# Patient Record
Sex: Male | Born: 1965 | Race: Black or African American | Hispanic: No | Marital: Married | Smoking: Never smoker
Health system: Southern US, Community
[De-identification: ages and names within clinical notes are randomized; demographics above are authoritative.]

## PROBLEM LIST (undated history)

## (undated) DIAGNOSIS — R51 Headache: Secondary | ICD-10-CM

## (undated) DIAGNOSIS — D649 Anemia, unspecified: Secondary | ICD-10-CM

## (undated) DIAGNOSIS — Z8669 Personal history of other diseases of the nervous system and sense organs: Secondary | ICD-10-CM

## (undated) DIAGNOSIS — C801 Malignant (primary) neoplasm, unspecified: Secondary | ICD-10-CM

## (undated) DIAGNOSIS — Z87442 Personal history of urinary calculi: Secondary | ICD-10-CM

## (undated) DIAGNOSIS — Z800 Family history of malignant neoplasm of digestive organs: Secondary | ICD-10-CM

## (undated) DIAGNOSIS — K6389 Other specified diseases of intestine: Secondary | ICD-10-CM

## (undated) DIAGNOSIS — C189 Malignant neoplasm of colon, unspecified: Secondary | ICD-10-CM

## (undated) DIAGNOSIS — R519 Headache, unspecified: Secondary | ICD-10-CM

## (undated) DIAGNOSIS — G2581 Restless legs syndrome: Secondary | ICD-10-CM

## (undated) HISTORY — DX: Malignant neoplasm of colon, unspecified: C18.9

## (undated) HISTORY — DX: Headache: R51

## (undated) HISTORY — DX: Anemia, unspecified: D64.9

## (undated) HISTORY — DX: Malignant (primary) neoplasm, unspecified: C80.1

## (undated) HISTORY — DX: Headache, unspecified: R51.9

## (undated) HISTORY — DX: Family history of malignant neoplasm of digestive organs: Z80.0

---

## 2008-04-11 ENCOUNTER — Emergency Department (HOSPITAL_COMMUNITY): Admission: EM | Admit: 2008-04-11 | Discharge: 2008-04-12 | Payer: Self-pay | Admitting: Emergency Medicine

## 2011-03-22 LAB — URINE MICROSCOPIC-ADD ON

## 2011-03-22 LAB — URINALYSIS, ROUTINE W REFLEX MICROSCOPIC
Bilirubin Urine: NEGATIVE
Glucose, UA: NEGATIVE
Ketones, ur: NEGATIVE
Leukocytes, UA: NEGATIVE
Nitrite: NEGATIVE
Protein, ur: NEGATIVE
Specific Gravity, Urine: 1.024
Urobilinogen, UA: 1
pH: 7.5

## 2012-04-04 ENCOUNTER — Ambulatory Visit
Admission: RE | Admit: 2012-04-04 | Discharge: 2012-04-04 | Disposition: A | Discharge status: Home / Self Care | Payer: BC Managed Care – PPO | Source: Ambulatory Visit | Attending: Family Medicine | Admitting: Family Medicine

## 2012-04-04 ENCOUNTER — Other Ambulatory Visit: Payer: Self-pay | Admitting: Family Medicine

## 2012-04-04 DIAGNOSIS — R509 Fever, unspecified: Secondary | ICD-10-CM

## 2012-04-04 DIAGNOSIS — R05 Cough: Secondary | ICD-10-CM

## 2012-04-04 DIAGNOSIS — R059 Cough, unspecified: Secondary | ICD-10-CM

## 2012-04-04 DIAGNOSIS — R0989 Other specified symptoms and signs involving the circulatory and respiratory systems: Secondary | ICD-10-CM

## 2012-08-14 ENCOUNTER — Telehealth: Payer: Self-pay | Admitting: Gastroenterology

## 2012-08-14 NOTE — Telephone Encounter (Signed)
Is he specifically asking for me or any [place] MD? We need to review his records.

## 2012-08-15 NOTE — Telephone Encounter (Signed)
Specifically asked for you, Dr Arlyce Dice. Records should be on your desk fo review.

## 2012-09-07 ENCOUNTER — Other Ambulatory Visit: Payer: Self-pay | Admitting: Gastroenterology

## 2012-09-07 DIAGNOSIS — R1013 Epigastric pain: Secondary | ICD-10-CM

## 2012-09-14 ENCOUNTER — Ambulatory Visit
Admission: RE | Admit: 2012-09-14 | Discharge: 2012-09-14 | Disposition: A | Discharge status: Home / Self Care | Payer: BC Managed Care – PPO | Source: Ambulatory Visit | Attending: Gastroenterology | Admitting: Gastroenterology

## 2012-09-14 DIAGNOSIS — R1013 Epigastric pain: Secondary | ICD-10-CM

## 2012-10-12 ENCOUNTER — Other Ambulatory Visit (HOSPITAL_COMMUNITY): Payer: Self-pay | Admitting: Gastroenterology

## 2012-10-12 DIAGNOSIS — R109 Unspecified abdominal pain: Secondary | ICD-10-CM

## 2012-10-24 ENCOUNTER — Encounter (HOSPITAL_COMMUNITY)
Admission: RE | Admit: 2012-10-24 | Discharge: 2012-10-24 | Disposition: A | Discharge status: Home / Self Care | Payer: BC Managed Care – PPO | Source: Ambulatory Visit | Attending: Gastroenterology | Admitting: Gastroenterology

## 2012-10-24 DIAGNOSIS — R109 Unspecified abdominal pain: Secondary | ICD-10-CM

## 2012-10-24 DIAGNOSIS — R1013 Epigastric pain: Secondary | ICD-10-CM | POA: Insufficient documentation

## 2012-10-24 MED ORDER — SINCALIDE 5 MCG IJ SOLR
INTRAMUSCULAR | Status: AC
  Administered 2012-10-24: 5 ug via INTRAVENOUS
  Filled 2012-10-24: qty 5

## 2012-10-24 MED ORDER — TECHNETIUM TC 99M MEBROFENIN IV KIT
5.0000 | PACK | Freq: Once | INTRAVENOUS | Status: AC | PRN
  Administered 2012-10-24: 5 via INTRAVENOUS

## 2012-10-24 MED ORDER — SINCALIDE 5 MCG IJ SOLR
0.0200 ug/kg | Freq: Once | INTRAMUSCULAR | Status: AC
  Administered 2012-10-24: 5 ug via INTRAVENOUS

## 2012-12-24 ENCOUNTER — Other Ambulatory Visit: Payer: Self-pay | Admitting: Family Medicine

## 2012-12-24 DIAGNOSIS — E611 Iron deficiency: Secondary | ICD-10-CM

## 2012-12-24 DIAGNOSIS — R109 Unspecified abdominal pain: Secondary | ICD-10-CM

## 2012-12-25 ENCOUNTER — Ambulatory Visit
Admission: RE | Admit: 2012-12-25 | Discharge: 2012-12-25 | Disposition: A | Discharge status: Home / Self Care | Payer: BC Managed Care – PPO | Source: Ambulatory Visit | Attending: Family Medicine | Admitting: Family Medicine

## 2012-12-25 DIAGNOSIS — E611 Iron deficiency: Secondary | ICD-10-CM

## 2012-12-25 DIAGNOSIS — R109 Unspecified abdominal pain: Secondary | ICD-10-CM

## 2012-12-25 MED ORDER — IOHEXOL 300 MG/ML  SOLN
100.0000 mL | Freq: Once | INTRAMUSCULAR | Status: AC | PRN
  Administered 2012-12-25: 100 mL via INTRAVENOUS

## 2013-01-01 ENCOUNTER — Inpatient Hospital Stay (HOSPITAL_COMMUNITY)
Admission: AD | Admit: 2013-01-01 | Discharge: 2013-01-09 | DRG: 148 | Disposition: A | Discharge status: Home / Self Care | Payer: BC Managed Care – PPO | Source: Ambulatory Visit | Attending: Surgery | Admitting: Surgery

## 2013-01-01 ENCOUNTER — Inpatient Hospital Stay (HOSPITAL_COMMUNITY): Payer: BC Managed Care – PPO

## 2013-01-01 DIAGNOSIS — C189 Malignant neoplasm of colon, unspecified: Secondary | ICD-10-CM

## 2013-01-01 DIAGNOSIS — Z87442 Personal history of urinary calculi: Secondary | ICD-10-CM | POA: Diagnosis present

## 2013-01-01 DIAGNOSIS — Y9210 Unspecified residential institution as the place of occurrence of the external cause: Secondary | ICD-10-CM | POA: Diagnosis present

## 2013-01-01 DIAGNOSIS — G43909 Migraine, unspecified, not intractable, without status migrainosus: Secondary | ICD-10-CM | POA: Diagnosis present

## 2013-01-01 DIAGNOSIS — Z8669 Personal history of other diseases of the nervous system and sense organs: Secondary | ICD-10-CM

## 2013-01-01 DIAGNOSIS — G2581 Restless legs syndrome: Secondary | ICD-10-CM | POA: Diagnosis present

## 2013-01-01 DIAGNOSIS — D490 Neoplasm of unspecified behavior of digestive system: Secondary | ICD-10-CM

## 2013-01-01 DIAGNOSIS — Y836 Removal of other organ (partial) (total) as the cause of abnormal reaction of the patient, or of later complication, without mention of misadventure at the time of the procedure: Secondary | ICD-10-CM | POA: Diagnosis not present

## 2013-01-01 DIAGNOSIS — K560 Paralytic ileus: Secondary | ICD-10-CM | POA: Diagnosis not present

## 2013-01-01 DIAGNOSIS — K6389 Other specified diseases of intestine: Secondary | ICD-10-CM | POA: Diagnosis present

## 2013-01-01 DIAGNOSIS — D509 Iron deficiency anemia, unspecified: Secondary | ICD-10-CM | POA: Diagnosis present

## 2013-01-01 HISTORY — DX: Other specified diseases of intestine: K63.89

## 2013-01-01 HISTORY — DX: Restless legs syndrome: G25.81

## 2013-01-01 HISTORY — DX: Personal history of other diseases of the nervous system and sense organs: Z86.69

## 2013-01-01 HISTORY — DX: Personal history of urinary calculi: Z87.442

## 2013-01-01 LAB — BASIC METABOLIC PANEL
BUN: 16 mg/dL (ref 6–23)
CO2: 29 mEq/L (ref 19–32)
Calcium: 9.1 mg/dL (ref 8.4–10.5)
Chloride: 107 mEq/L (ref 96–112)
Creatinine, Ser: 1.12 mg/dL (ref 0.50–1.35)
GFR calc Af Amer: >90 mL/min (ref >90)
GFR calc non Af Amer: 78 mL/min — ABNORMAL LOW (ref >90)
Glucose, Bld: 86 mg/dL (ref 70–99)
Potassium: 4.1 mEq/L (ref 3.5–5.1)
Sodium: 142 mEq/L (ref 135–145)

## 2013-01-01 LAB — CBC WITH DIFFERENTIAL/PLATELET
Basophils Absolute: 0.1 10*3/uL (ref 0.0–0.1)
Basophils Relative: 1 % (ref 0–1)
Eosinophils Absolute: 0.2 10*3/uL (ref 0.0–0.7)
Eosinophils Relative: 3 % (ref 0–5)
HCT: 32.2 % — ABNORMAL LOW (ref 39.0–52.0)
Hemoglobin: 10 g/dL — ABNORMAL LOW (ref 13.0–17.0)
Lymphocytes Relative: 23 % (ref 12–46)
Lymphs Abs: 1.6 10*3/uL (ref 0.7–4.0)
MCH: 21.6 pg — ABNORMAL LOW (ref 26.0–34.0)
MCHC: 31.1 g/dL (ref 30.0–36.0)
MCV: 69.7 fL — ABNORMAL LOW (ref 78.0–100.0)
Monocytes Absolute: 0.5 10*3/uL (ref 0.1–1.0)
Monocytes Relative: 7 % (ref 3–12)
Neutro Abs: 4.4 10*3/uL (ref 1.7–7.7)
Neutrophils Relative %: 66 % (ref 43–77)
Platelets: 271 10*3/uL (ref 150–400)
RBC: 4.62 MIL/uL (ref 4.22–5.81)
RDW: 16.6 % — ABNORMAL HIGH (ref 11.5–15.5)
WBC: 6.8 10*3/uL (ref 4.0–10.5)

## 2013-01-01 LAB — COMPREHENSIVE METABOLIC PANEL
ALT: 14 U/L (ref 0–53)
AST: 15 U/L (ref 0–37)
Albumin: 3.4 g/dL — ABNORMAL LOW (ref 3.5–5.2)
Alkaline Phosphatase: 86 U/L (ref 39–117)
BUN: 15 mg/dL (ref 6–23)
CO2: 29 mEq/L (ref 19–32)
Calcium: 9.1 mg/dL (ref 8.4–10.5)
Chloride: 106 mEq/L (ref 96–112)
Creatinine, Ser: 1.12 mg/dL (ref 0.50–1.35)
GFR calc Af Amer: >90 mL/min (ref >90)
GFR calc non Af Amer: 78 mL/min — ABNORMAL LOW (ref >90)
Glucose, Bld: 85 mg/dL (ref 70–99)
Potassium: 4.1 mEq/L (ref 3.5–5.1)
Sodium: 142 mEq/L (ref 135–145)
Total Bilirubin: 0.5 mg/dL (ref 0.3–1.2)
Total Protein: 6.9 g/dL (ref 6.0–8.3)

## 2013-01-01 LAB — CBC
HCT: 32.5 % — ABNORMAL LOW (ref 39.0–52.0)
Hemoglobin: 10 g/dL — ABNORMAL LOW (ref 13.0–17.0)
MCH: 21.4 pg — ABNORMAL LOW (ref 26.0–34.0)
MCHC: 30.8 g/dL (ref 30.0–36.0)
MCV: 69.4 fL — ABNORMAL LOW (ref 78.0–100.0)
Platelets: 257 10*3/uL (ref 150–400)
RBC: 4.68 MIL/uL (ref 4.22–5.81)
RDW: 16.6 % — ABNORMAL HIGH (ref 11.5–15.5)
WBC: 6.7 10*3/uL (ref 4.0–10.5)

## 2013-01-01 LAB — PROTIME-INR
INR: 0.95 (ref 0.00–1.49)
INR: 1.08 (ref 0.00–1.49)
Prothrombin Time: 12.5 seconds (ref 11.6–15.2)
Prothrombin Time: 13.8 seconds (ref 11.6–15.2)

## 2013-01-01 LAB — TYPE AND SCREEN
ABO/RH(D): O POS
Antibody Screen: NEGATIVE

## 2013-01-01 MED ORDER — ACETAMINOPHEN 650 MG RE SUPP: 650.0000 mg | Freq: Four times a day (QID) | RECTAL | Status: DC | PRN

## 2013-01-01 MED ORDER — ACETAMINOPHEN 325 MG PO TABS: 650.0000 mg | ORAL_TABLET | Freq: Four times a day (QID) | ORAL | Status: DC | PRN

## 2013-01-01 MED ORDER — METRONIDAZOLE IN NACL 5-0.79 MG/ML-% IV SOLN
500.0000 mg | INTRAVENOUS | Status: AC
  Administered 2013-01-02: 500 mg via INTRAVENOUS
  Filled 2013-01-01: qty 100

## 2013-01-01 MED ORDER — ALVIMOPAN 12 MG PO CAPS
12.0000 mg | ORAL_CAPSULE | Freq: Once | ORAL | Status: AC
  Administered 2013-01-02: 12 mg via ORAL
  Filled 2013-01-01: qty 1

## 2013-01-01 MED ORDER — SODIUM CHLORIDE 0.9 % IV SOLN
INTRAVENOUS | Status: DC
  Administered 2013-01-01 – 2013-01-02 (×2): via INTRAVENOUS

## 2013-01-01 MED ORDER — CEFAZOLIN SODIUM-DEXTROSE 2-3 GM-% IV SOLR
2.0000 g | INTRAVENOUS | Status: AC
  Administered 2013-01-02: 2 g via INTRAVENOUS
  Filled 2013-01-01: qty 50

## 2013-01-01 MED ORDER — ONDANSETRON HCL 4 MG PO TABS: 4.0000 mg | ORAL_TABLET | Freq: Four times a day (QID) | ORAL | Status: DC | PRN

## 2013-01-01 MED ORDER — ONDANSETRON HCL 4 MG/2ML IJ SOLN
4.0000 mg | Freq: Four times a day (QID) | INTRAMUSCULAR | Status: DC | PRN
  Administered 2013-01-03 – 2013-01-04 (×4): 4 mg via INTRAVENOUS
  Filled 2013-01-01 (×4): qty 2

## 2013-01-01 MED ORDER — PRAMIPEXOLE DIHYDROCHLORIDE 0.25 MG PO TABS
0.2500 mg | ORAL_TABLET | Freq: Every evening | ORAL | Status: DC | PRN
  Filled 2013-01-01 (×18): qty 1

## 2013-01-01 MED ORDER — MORPHINE SULFATE 2 MG/ML IJ SOLN: 2.0000 mg | Freq: Three times a day (TID) | INTRAMUSCULAR | Status: DC | PRN

## 2013-01-01 MED ORDER — ZOLPIDEM TARTRATE 5 MG PO TABS: 5.0000 mg | ORAL_TABLET | Freq: Every evening | ORAL | Status: DC | PRN

## 2013-01-01 NOTE — Consult Note (Addendum)
Reason for Consult:obstructing colon cancer Referring Physician:  Gurkaran Rahm is an 47 y.o. male.  HPI:  Pt is a 47 year old male who presents following a colonoscopy which is positive for a near obstructing right colon cancer.  He actually has had a year of symptoms. He has felt like he has a blockage. He says he has 3-4 weeks where he feels OK, but then has 3-4 days of nausea and vomiting.  He has been anemic as well.  He feels like he has had rumbling pain that passes across his abdomen.  He has been taking ibuprofen which has helped with the pain.  He underwent ultrasound and [place] this April, which were both normal.  He finally had ct scan last week which demonstrated a right colon mass.  He was admitted from GI today as Dr. Bosie Clos was unable to pass the scope past the mass.  He has generally been able to eat, and he has not had significant weight loss.  He denies frank blood in his stool.  He has actually done stool cards which were negative for blood.    PMH: Migraines; Restless leg syndrome; History of Kidney stones   PSH: wisdom teeth removal   SH: Denies smoking or alcohol   Meds: Allegra-D; Sumatriptan; Mirapex (see pharmacy notes for doses)  PFH:  Maternal great aunt with colon cancer, no other malignancies known in family.    No family history on file.  Social History:  has no tobacco, alcohol, and drug history on file.  Allergies:  Allergies  Allergen Reactions  . Bee Venom Anaphylaxis  . Fioricet (Butalbital-Apap-Caffeine) Other (See Comments)    dizziness     Results for orders placed during the hospital encounter of 01/01/13 (from the past 48 hour(s))  COMPREHENSIVE METABOLIC PANEL     Status: Abnormal   Collection Time    01/01/13  6:34 PM      Result Value Range   Sodium 142  135 - 145 mEq/L   Potassium 4.1  3.5 - 5.1 mEq/L   Chloride 106  96 - 112 mEq/L   CO2 29  19 - 32 mEq/L   Glucose, Bld 85  70 - 99 mg/dL   BUN 15  6 - 23 mg/dL    Creatinine, Ser 1.91  0.50 - 1.35 mg/dL   Calcium 9.1  8.4 - 47.8 mg/dL   Total Protein 6.9  6.0 - 8.3 g/dL   Albumin 3.4 (*) 3.5 - 5.2 g/dL   AST 15  0 - 37 U/L   ALT 14  0 - 53 U/L   Alkaline Phosphatase 86  39 - 117 U/L   Total Bilirubin 0.5  0.3 - 1.2 mg/dL   GFR calc non Af Amer 78 (*) >90 mL/min   GFR calc Af Amer >90  >90 mL/min   Comment:            The eGFR has been calculated     using the CKD EPI equation.     This calculation has not been     validated in all clinical     situations.     eGFR's persistently     <90 mL/min signify     possible Chronic Kidney Disease.  CBC WITH DIFFERENTIAL     Status: Abnormal   Collection Time    01/01/13  6:34 PM      Result Value Range   WBC 6.8  4.0 - 10.5 K/uL   RBC  4.62  4.22 - 5.81 MIL/uL   Hemoglobin 10.0 (*) 13.0 - 17.0 g/dL   HCT 16.1 (*) 09.6 - 04.5 %   MCV 69.7 (*) 78.0 - 100.0 fL   MCH 21.6 (*) 26.0 - 34.0 pg   MCHC 31.1  30.0 - 36.0 g/dL   RDW 40.9 (*) 81.1 - 91.4 %   Platelets 271  150 - 400 K/uL   Neutrophils Relative % 66  43 - 77 %   Lymphocytes Relative 23  12 - 46 %   Monocytes Relative 7  3 - 12 %   Eosinophils Relative 3  0 - 5 %   Basophils Relative 1  0 - 1 %   Neutro Abs 4.4  1.7 - 7.7 K/uL   Lymphs Abs 1.6  0.7 - 4.0 K/uL   Monocytes Absolute 0.5  0.1 - 1.0 K/uL   Eosinophils Absolute 0.2  0.0 - 0.7 K/uL   Basophils Absolute 0.1  0.0 - 0.1 K/uL   Smear Review MORPHOLOGY UNREMARKABLE    PROTIME-INR     Status: None   Collection Time    01/01/13  6:34 PM      Result Value Range   Prothrombin Time 12.5  11.6 - 15.2 seconds   INR 0.95  0.00 - 1.49  BASIC METABOLIC PANEL     Status: Abnormal   Collection Time    01/01/13  6:34 PM      Result Value Range   Sodium 142  135 - 145 mEq/L   Potassium 4.1  3.5 - 5.1 mEq/L   Chloride 107  96 - 112 mEq/L   CO2 29  19 - 32 mEq/L   Glucose, Bld 86  70 - 99 mg/dL   BUN 16  6 - 23 mg/dL   Creatinine, Ser 7.82  0.50 - 1.35 mg/dL   Calcium 9.1  8.4 - 95.6  mg/dL   GFR calc non Af Amer 78 (*) >90 mL/min   GFR calc Af Amer >90  >90 mL/min   Comment:            The eGFR has been calculated     using the CKD EPI equation.     This calculation has not been     validated in all clinical     situations.     eGFR's persistently     <90 mL/min signify     possible Chronic Kidney Disease.  CBC     Status: Abnormal   Collection Time    01/01/13  6:34 PM      Result Value Range   WBC 6.7  4.0 - 10.5 K/uL   RBC 4.68  4.22 - 5.81 MIL/uL   Hemoglobin 10.0 (*) 13.0 - 17.0 g/dL   HCT 21.3 (*) 08.6 - 57.8 %   MCV 69.4 (*) 78.0 - 100.0 fL   MCH 21.4 (*) 26.0 - 34.0 pg   MCHC 30.8  30.0 - 36.0 g/dL   RDW 46.9 (*) 62.9 - 52.8 %   Platelets 257  150 - 400 K/uL    X-ray Chest Pa And Lateral   01/01/2013   *RADIOLOGY REPORT*  Clinical Data: Preop evaluation.  Colon cancer.  CHEST - 2 VIEW  Comparison: 04/04/2012  Findings: Two views of the chest demonstrate clear lungs.  Heart and mediastinum are within normal limits.  The trachea is midline. Gas-filled loops of bowel in the upper abdomen.  Bony thorax is intact.  IMPRESSION: No acute cardiopulmonary disease.  Original Report Authenticated By: Richarda Overlie, M.D.    Review of Systems  Constitutional: Negative.   HENT: Positive for nosebleeds.   Eyes: Negative.   Respiratory: Negative.   Cardiovascular: Negative.   Gastrointestinal: Positive for nausea, vomiting, abdominal pain and constipation.  Genitourinary: Negative.   Musculoskeletal: Negative.   Skin: Negative.   Neurological: Negative.   Endo/Heme/Allergies: Negative.   Psychiatric/Behavioral: Negative.    Blood pressure 126/80, pulse 80, temperature 97.9 F (36.6 C), temperature source Oral, resp. rate 17, SpO2 100.00%. Physical Exam  Constitutional: He is oriented to person, place, and time. He appears well-developed and well-nourished. No distress.  HENT:  Head: Normocephalic and atraumatic.  Mouth/Throat: Oropharynx is clear and  moist.  Eyes: Conjunctivae are normal. Pupils are equal, round, and reactive to light. Right eye exhibits no discharge. Left eye exhibits no discharge. No scleral icterus.  Neck: Normal range of motion. Neck supple. No thyromegaly present.  Cardiovascular: Normal rate, regular rhythm, normal heart sounds and intact distal pulses.  Exam reveals no gallop and no friction rub.   No murmur heard. Respiratory: Effort normal and breath sounds normal. No respiratory distress. He has no wheezes. He has no rales. He exhibits no tenderness.  GI: Soft. Bowel sounds are normal. He exhibits no distension and no mass. There is no tenderness. There is no rebound and no guarding.  Musculoskeletal: He exhibits no edema and no tenderness.  Neurological: He is alert and oriented to person, place, and time. Coordination normal.  Skin: Skin is warm and dry. No rash noted. He is not diaphoretic. No erythema. No pallor.  Psychiatric: He has a normal mood and affect. His behavior is normal. Judgment and thought content normal.    Assessment/Plan: Colon cancer- ascending colon, pathology pending.   Pt has already been bowel prepped. CEA pending CT negative for metastatic disease  Needs partial colectomy of proximal colon in urgent time frame (within a few days). Will set up for Dr. Carolynne Edouard. Hope for surgery tomorrow barring unforeseen consequences.   Consent, antibiotics OCTOR Entereg pre op. NPO after midnight.    Reviewed risks and benefits of colectomy including bleeding, infection, leak, possible need for further surgeries or procedures, possible risk of transfusion, possible need for opening incision, blood clot, pneumonia, urinary infection, etc.   Reviewed the actual surgery, incisions, post op course, recovery. Dr. Carolynne Edouard to see tomorrow.    Will get type and screen Will need chest CT as part of staging.  Further treatment will depend on final pathology.  60 min spent in evaluation, examination,  coordination of care, and counseling with patient and family.  >50% of time spent in counseling.    Byron Tipping 01/01/2013, 9:47 PM

## 2013-01-01 NOTE — H&P (Signed)
  Jeremiah Sanford is an 47 y.o. male.   Chief Complaint: Colon mass; Iron deficiency anemia   HPI: Jeremiah Sanford is a 47 yo man who has been having epigastric pain where he sometimes would hear "gurgling" sounds in his abdomen that would dissipate at times and he would sometimes feel like stool was "moving through my colon" and the pain would dissipate. Denies any trouble moving his bowels and and reports normal formed stools. Had one episode of bleeding about 4 months ago after defecation that resolved after 1-2 days. Recently found to be anemic with Hgb 9.9 and CT was done by his PCP, Dr. Manus Gunning and a large mass was noted in the ascending colon. A colonoscopy done today showed an obstructing fungating mass in the ascending colon. A lumen proximal to the mass could not be seen and the colonoscope did not traverse the lesion. The distal margin was tattooed with inkspot. Patient is being admitted for IV hydration and management of his obstructing colon malignancy, which will require surgery.  PMH: Migraines; Restless leg syndrome; History of Kidney stones  PSH: wisdom teeth removal  FH: noncontributory  SH: Denies smoking or alcohol  Meds: Allegra-D; Sumatriptan; Mirapex (see pharmacy notes for doses)  Allergies: Fioricet: dizziness  ROS: Negative from a GI standpoint except as stated above   VITALS: AFVSS   PHYSICAL EXAM: General appearance: alert, well-nourished, no acute distress HENT: oropharynx clear, no erythema Eyes: anicteric Skin: no jaundice Resp: clear to ascultation bilaterally Cardio: regular, rate, and rhythm no murmurs, rubs, or gallops GI: soft, nontender, nondistended, +BS, no mass appreciated Rectal: nontender, normal DRE, no blood on gloved finger Psych: normal affect, mood   Assessment/Plan 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 D/W Dr. Donell Beers. Preop CEA and labs ordered. Ice chips and sips of water ok today  and NPO p MN. Dr. Matthias Hughs to see tomorrow. Dr. Carolynne Edouard is surgeon to f/u tomorrow.  Jeremiah Baranek C. 01/01/2013, 5:35 PM

## 2013-01-02 ENCOUNTER — Encounter (HOSPITAL_COMMUNITY): Payer: Self-pay | Admitting: Anesthesiology

## 2013-01-02 ENCOUNTER — Encounter (HOSPITAL_COMMUNITY): Admission: AD | Disposition: A | Discharge status: Home / Self Care | Payer: Self-pay | Source: Ambulatory Visit

## 2013-01-02 ENCOUNTER — Inpatient Hospital Stay (HOSPITAL_COMMUNITY): Payer: BC Managed Care – PPO | Admitting: Anesthesiology

## 2013-01-02 DIAGNOSIS — C19 Malignant neoplasm of rectosigmoid junction: Secondary | ICD-10-CM

## 2013-01-02 HISTORY — PX: LAPAROSCOPIC PARTIAL COLECTOMY: SHX5907

## 2013-01-02 HISTORY — PX: PARTIAL COLECTOMY: SHX5273

## 2013-01-02 LAB — CEA: CEA: <0.5 ng/mL (ref 0.0–5.0)

## 2013-01-02 LAB — SURGICAL PCR SCREEN
MRSA, PCR: NEGATIVE
Staphylococcus aureus: NEGATIVE

## 2013-01-02 LAB — ABO/RH: ABO/RH(D): O POS

## 2013-01-02 SURGERY — LAPAROSCOPIC PARTIAL COLECTOMY
Anesthesia: General | Site: Abdomen | Laterality: Right

## 2013-01-02 MED ORDER — ARTIFICIAL TEARS OP OINT
TOPICAL_OINTMENT | OPHTHALMIC | Status: DC | PRN
  Administered 2013-01-02: 1 via OPHTHALMIC

## 2013-01-02 MED ORDER — SODIUM CHLORIDE 0.9 % IR SOLN
Status: DC | PRN
  Administered 2013-01-02: 1000 mL

## 2013-01-02 MED ORDER — LIDOCAINE HCL (CARDIAC) 20 MG/ML IV SOLN
INTRAVENOUS | Status: DC | PRN
  Administered 2013-01-02: 60 mg via INTRAVENOUS

## 2013-01-02 MED ORDER — BIOTENE DRY MOUTH MT LIQD
15.0000 mL | Freq: Two times a day (BID) | OROMUCOSAL | Status: DC
  Administered 2013-01-03 – 2013-01-07 (×7): 15 mL via OROMUCOSAL

## 2013-01-02 MED ORDER — HYDROMORPHONE HCL PF 1 MG/ML IJ SOLN
0.2500 mg | INTRAMUSCULAR | Status: DC | PRN
  Administered 2013-01-02: 1 mg via INTRAVENOUS

## 2013-01-02 MED ORDER — ONDANSETRON HCL 4 MG/2ML IJ SOLN: 4.0000 mg | Freq: Four times a day (QID) | INTRAMUSCULAR | Status: DC | PRN

## 2013-01-02 MED ORDER — CHLORHEXIDINE GLUCONATE 0.12 % MT SOLN
15.0000 mL | Freq: Two times a day (BID) | OROMUCOSAL | Status: DC
  Administered 2013-01-02 – 2013-01-07 (×8): 15 mL via OROMUCOSAL
  Filled 2013-01-02 (×8): qty 15

## 2013-01-02 MED ORDER — 0.9 % SODIUM CHLORIDE (POUR BTL) OPTIME
TOPICAL | Status: DC | PRN
  Administered 2013-01-02: 2000 mL

## 2013-01-02 MED ORDER — MIDAZOLAM HCL 5 MG/5ML IJ SOLN
INTRAMUSCULAR | Status: DC | PRN
  Administered 2013-01-02: 2 mg via INTRAVENOUS

## 2013-01-02 MED ORDER — PROPOFOL 10 MG/ML IV BOLUS
INTRAVENOUS | Status: DC | PRN
  Administered 2013-01-02: 200 mg via INTRAVENOUS

## 2013-01-02 MED ORDER — MORPHINE SULFATE (PF) 1 MG/ML IV SOLN
INTRAVENOUS | Status: DC
  Administered 2013-01-02 (×2): via INTRAVENOUS
  Administered 2013-01-02: 9 mg via INTRAVENOUS
  Administered 2013-01-02: 14.81 mg via INTRAVENOUS
  Administered 2013-01-03: 12 mg via INTRAVENOUS
  Administered 2013-01-03 (×3): via INTRAVENOUS
  Administered 2013-01-03: 10.7 mg via INTRAVENOUS
  Administered 2013-01-03: 3.95 mg via INTRAVENOUS
  Filled 2013-01-02 (×4): qty 25

## 2013-01-02 MED ORDER — BUPIVACAINE-EPINEPHRINE 0.25% -1:200000 IJ SOLN
INTRAMUSCULAR | Status: DC | PRN
  Administered 2013-01-02: 15 mL

## 2013-01-02 MED ORDER — ALBUMIN HUMAN 5 % IV SOLN
INTRAVENOUS | Status: DC | PRN
  Administered 2013-01-02: 13:00:00 via INTRAVENOUS

## 2013-01-02 MED ORDER — ROCURONIUM BROMIDE 100 MG/10ML IV SOLN
INTRAVENOUS | Status: DC | PRN
  Administered 2013-01-02: 50 mg via INTRAVENOUS
  Administered 2013-01-02: 10 mg via INTRAVENOUS
  Administered 2013-01-02: 20 mg via INTRAVENOUS

## 2013-01-02 MED ORDER — NALOXONE HCL 0.4 MG/ML IJ SOLN: 0.4000 mg | INTRAMUSCULAR | Status: DC | PRN

## 2013-01-02 MED ORDER — DIPHENHYDRAMINE HCL 50 MG/ML IJ SOLN: 25.0000 mg | Freq: Four times a day (QID) | INTRAMUSCULAR | Status: DC | PRN

## 2013-01-02 MED ORDER — SODIUM CHLORIDE 0.9 % IJ SOLN: 9.0000 mL | INTRAMUSCULAR | Status: DC | PRN

## 2013-01-02 MED ORDER — ONDANSETRON HCL 4 MG/2ML IJ SOLN
INTRAMUSCULAR | Status: DC | PRN
  Administered 2013-01-02: 4 mg via INTRAVENOUS

## 2013-01-02 MED ORDER — ALVIMOPAN 12 MG PO CAPS
12.0000 mg | ORAL_CAPSULE | Freq: Two times a day (BID) | ORAL | Status: DC
  Administered 2013-01-03 – 2013-01-08 (×6): 12 mg via ORAL
  Filled 2013-01-02 (×13): qty 1

## 2013-01-02 MED ORDER — KCL IN DEXTROSE-NACL 20-5-0.45 MEQ/L-%-% IV SOLN
INTRAVENOUS | Status: DC
  Administered 2013-01-02 – 2013-01-08 (×11): via INTRAVENOUS
  Filled 2013-01-02 (×18): qty 1000

## 2013-01-02 MED ORDER — OXYCODONE HCL 5 MG PO TABS: 5.0000 mg | ORAL_TABLET | Freq: Once | ORAL | Status: DC | PRN

## 2013-01-02 MED ORDER — HEPARIN SODIUM (PORCINE) 5000 UNIT/ML IJ SOLN
5000.0000 [IU] | Freq: Three times a day (TID) | INTRAMUSCULAR | Status: DC
  Administered 2013-01-03 – 2013-01-09 (×18): 5000 [IU] via SUBCUTANEOUS
  Filled 2013-01-02 (×23): qty 1

## 2013-01-02 MED ORDER — LACTATED RINGERS IV SOLN
INTRAVENOUS | Status: DC | PRN
  Administered 2013-01-02 (×4): via INTRAVENOUS

## 2013-01-02 MED ORDER — HYDROMORPHONE HCL PF 1 MG/ML IJ SOLN
INTRAMUSCULAR | Status: DC | PRN
  Administered 2013-01-02: .5 mg via INTRAVENOUS

## 2013-01-02 MED ORDER — PROMETHAZINE HCL 25 MG/ML IJ SOLN
12.5000 mg | Freq: Four times a day (QID) | INTRAMUSCULAR | Status: DC | PRN
  Administered 2013-01-02 – 2013-01-03 (×2): 12.5 mg via INTRAVENOUS
  Filled 2013-01-02: qty 1

## 2013-01-02 MED ORDER — NEOSTIGMINE METHYLSULFATE 1 MG/ML IJ SOLN
INTRAMUSCULAR | Status: DC | PRN
  Administered 2013-01-02: 5 mg via INTRAVENOUS

## 2013-01-02 MED ORDER — GLYCOPYRROLATE 0.2 MG/ML IJ SOLN
INTRAMUSCULAR | Status: DC | PRN
  Administered 2013-01-02: 0.6 mg via INTRAVENOUS

## 2013-01-02 MED ORDER — FENTANYL CITRATE 0.05 MG/ML IJ SOLN
INTRAMUSCULAR | Status: DC | PRN
  Administered 2013-01-02: 50 ug via INTRAVENOUS
  Administered 2013-01-02 (×2): 100 ug via INTRAVENOUS
  Administered 2013-01-02 (×2): 50 ug via INTRAVENOUS
  Administered 2013-01-02: 100 ug via INTRAVENOUS
  Administered 2013-01-02: 50 ug via INTRAVENOUS

## 2013-01-02 MED ORDER — DIPHENHYDRAMINE HCL 25 MG PO CAPS: 25.0000 mg | ORAL_CAPSULE | Freq: Four times a day (QID) | ORAL | Status: DC | PRN

## 2013-01-02 MED ORDER — ACETAMINOPHEN 325 MG PO TABS
650.0000 mg | ORAL_TABLET | Freq: Four times a day (QID) | ORAL | Status: DC | PRN
  Filled 2013-01-02: qty 2

## 2013-01-02 MED ORDER — ALUM & MAG HYDROXIDE-SIMETH 200-200-20 MG/5ML PO SUSP: 30.0000 mL | Freq: Four times a day (QID) | ORAL | Status: DC | PRN

## 2013-01-02 MED ORDER — OXYCODONE HCL 5 MG/5ML PO SOLN: 5.0000 mg | Freq: Once | ORAL | Status: DC | PRN

## 2013-01-02 SURGICAL SUPPLY — 71 items
APPLIER CLIP ROT 10 11.4 M/L (STAPLE)
BLADE SURG 10 STRL SS (BLADE) ×3 IMPLANT
BLADE SURG ROTATE 9660 (MISCELLANEOUS) IMPLANT
CANISTER SUCTION 2500CC (MISCELLANEOUS) ×3 IMPLANT
CELLS DAT CNTRL 66122 CELL SVR (MISCELLANEOUS) ×2 IMPLANT
CHLORAPREP W/TINT 26ML (MISCELLANEOUS) ×3 IMPLANT
CLIP APPLIE ROT 10 11.4 M/L (STAPLE) IMPLANT
CLOTH BEACON ORANGE TIMEOUT ST (SAFETY) ×3 IMPLANT
COVER MAYO STAND STRL (DRAPES) ×3 IMPLANT
COVER SURGICAL LIGHT HANDLE (MISCELLANEOUS) ×3 IMPLANT
DECANTER SPIKE VIAL GLASS SM (MISCELLANEOUS) ×3 IMPLANT
DRAPE PROXIMA HALF (DRAPES) IMPLANT
DRAPE UTILITY 15X26 W/TAPE STR (DRAPE) ×9 IMPLANT
DRAPE WARM FLUID 44X44 (DRAPE) ×3 IMPLANT
DRSG OPSITE 11X17.75 LRG (GAUZE/BANDAGES/DRESSINGS) ×3 IMPLANT
ELECT BLADE 6.5 EXT (BLADE) ×3 IMPLANT
ELECT CAUTERY BLADE 6.4 (BLADE) ×6 IMPLANT
ELECT REM PT RETURN 9FT ADLT (ELECTROSURGICAL) ×3
ELECTRODE REM PT RTRN 9FT ADLT (ELECTROSURGICAL) ×2 IMPLANT
GEL ULTRASOUND 20GR AQUASONIC (MISCELLANEOUS) IMPLANT
GLOVE BIO SURGEON STRL SZ7.5 (GLOVE) ×15 IMPLANT
GLOVE BIOGEL PI IND STRL 7.0 (GLOVE) ×4 IMPLANT
GLOVE BIOGEL PI IND STRL 7.5 (GLOVE) ×4 IMPLANT
GLOVE BIOGEL PI INDICATOR 7.0 (GLOVE) ×2
GLOVE BIOGEL PI INDICATOR 7.5 (GLOVE) ×2
GLOVE ECLIPSE 7.0 STRL STRAW (GLOVE) ×6 IMPLANT
GLOVE ECLIPSE 7.5 STRL STRAW (GLOVE) ×9 IMPLANT
GOWN STRL NON-REIN LRG LVL3 (GOWN DISPOSABLE) ×18 IMPLANT
KIT BASIN OR (CUSTOM PROCEDURE TRAY) ×3 IMPLANT
KIT ROOM TURNOVER OR (KITS) ×3 IMPLANT
LIGASURE IMPACT 36 18CM CVD LR (INSTRUMENTS) ×3 IMPLANT
NS IRRIG 1000ML POUR BTL (IV SOLUTION) ×6 IMPLANT
PAD ARMBOARD 7.5X6 YLW CONV (MISCELLANEOUS) ×6 IMPLANT
PAD SHARPS MAGNETIC DISPOSAL (MISCELLANEOUS) ×3 IMPLANT
PENCIL BUTTON HOLSTER BLD 10FT (ELECTRODE) ×3 IMPLANT
RELOAD PROXIMATE 75MM BLUE (ENDOMECHANICALS) ×6 IMPLANT
RTRCTR WOUND ALEXIS 18CM MED (MISCELLANEOUS) ×3
SCALPEL HARMONIC ACE (MISCELLANEOUS) ×3 IMPLANT
SCISSORS LAP 5X35 DISP (ENDOMECHANICALS) IMPLANT
SET IRRIG TUBING LAPAROSCOPIC (IRRIGATION / IRRIGATOR) IMPLANT
SLEEVE ENDOPATH XCEL 5M (ENDOMECHANICALS) ×3 IMPLANT
SPECIMEN JAR LARGE (MISCELLANEOUS) ×3 IMPLANT
STAPLER GUN LINEAR PROX 60 (STAPLE) ×3 IMPLANT
STAPLER PROXIMATE 75MM BLUE (STAPLE) ×3 IMPLANT
STAPLER VISISTAT 35W (STAPLE) ×3 IMPLANT
SURGILUBE 2OZ TUBE FLIPTOP (MISCELLANEOUS) IMPLANT
SUT PDS AB 1 CT  36 (SUTURE) ×2
SUT PDS AB 1 CT 36 (SUTURE) ×4 IMPLANT
SUT PDS AB 1 TP1 96 (SUTURE) ×6 IMPLANT
SUT PROLENE 2 0 CT2 30 (SUTURE) IMPLANT
SUT PROLENE 2 0 KS (SUTURE) IMPLANT
SUT SILK 2 0 (SUTURE) ×1
SUT SILK 2 0 SH CR/8 (SUTURE) ×3 IMPLANT
SUT SILK 2-0 18XBRD TIE 12 (SUTURE) ×2 IMPLANT
SUT SILK 3 0 (SUTURE) ×1
SUT SILK 3 0 SH CR/8 (SUTURE) ×3 IMPLANT
SUT SILK 3-0 18XBRD TIE 12 (SUTURE) ×2 IMPLANT
SYR BULB IRRIGATION 50ML (SYRINGE) ×3 IMPLANT
SYS LAPSCP GELPORT 120MM (MISCELLANEOUS)
SYSTEM LAPSCP GELPORT 120MM (MISCELLANEOUS) IMPLANT
TAPE CLOTH SURG 4X10 WHT LF (GAUZE/BANDAGES/DRESSINGS) ×3 IMPLANT
TOWEL OR 17X26 10 PK STRL BLUE (TOWEL DISPOSABLE) ×6 IMPLANT
TRAY FOLEY CATH 14FRSI W/METER (CATHETERS) ×3 IMPLANT
TRAY LAPAROSCOPIC (CUSTOM PROCEDURE TRAY) ×3 IMPLANT
TROCAR XCEL 12X100 BLDLESS (ENDOMECHANICALS) IMPLANT
TROCAR XCEL BLUNT TIP 100MML (ENDOMECHANICALS) IMPLANT
TROCAR XCEL NON-BLD 11X100MML (ENDOMECHANICALS) IMPLANT
TROCAR XCEL NON-BLD 5MMX100MML (ENDOMECHANICALS) ×3 IMPLANT
TUBE CONNECTING 12X1/4 (SUCTIONS) ×3 IMPLANT
WATER STERILE IRR 1000ML POUR (IV SOLUTION) IMPLANT
YANKAUER SUCT BULB TIP NO VENT (SUCTIONS) ×9 IMPLANT

## 2013-01-02 NOTE — Op Note (Signed)
01/01/2013 - 01/02/2013  3:27 PM  PATIENT:  Jeremiah Sanford  47 y.o. male  PRE-OPERATIVE DIAGNOSIS:  Colon Mass  POST-OPERATIVE DIAGNOSIS:  Colon Mass  PROCEDURE:  Procedure(s): LAPAROSCOPIC PROXIMAL COLECTOMY (N/A) PARTIAL COLECTOMY (Right)  SURGEON:  Surgeon(s) and Role:    * [name], MD - Primary  PHYSICIAN ASSISTANT:   ASSISTANTS: Barnetta Chapel, PA   ANESTHESIA:   general  EBL:  Total I/O In: 3500 [I.V.:3000; IV Piggyback:500] Out: 250 [Urine:150; Blood:100]  BLOOD ADMINISTERED:none  DRAINS: none   LOCAL MEDICATIONS USED:  MARCAINE     SPECIMEN:  Source of Specimen:  terminal ileum and right colon  DISPOSITION OF SPECIMEN:  PATHOLOGY  COUNTS:  YES  TOURNIQUET:  * No tourniquets in log *  DICTATION: .Dragon Dictation After informed consent was obtained the patient was brought to the operating room placed in the supine position on the operating room table. After adequate induction of general anesthesia the patient's abdomen was prepped with ChloraPrep, allowed to dry, and draped in usual sterile manner. A site was chosen in the left upper quadrant for placement of a 5 mm Optiview port. This area was infiltrated with quarter percent Marcaine. A small stab incision was made with a 15 blade knife. A 5 mm Optiview port was then used to dissect bluntly through the layers of the abdominal wall until access was gained to the abdominal cavity. The abdomen was then insufflated with carbon dioxide without difficulty. 2 other 5 mm ports were placed in the lower abdomen under direct vision without difficulty. The camera was moved to the suprapubic area. The right colon was mobilized by incising its retroperitoneal attachment along the white line of Toldt. Once the terminal ileum and right colon were fairly mobile we were able to identify the tumor near the hepatic flexure. The tumor seemed somewhat adherent to the liver and it was difficult to follow the duodenum safely through  the same area. Because of this we decided to make an upper midline incision with a 10 blade knife. This incision was carried through the skin and subcutaneous tissue sharply with electrocautery until the linea alba was identified. The linea alba was incised with the electrocautery. The pre-peroneal space was probed bluntly with the hemostat until the peritoneum was opened and access was gained to the abdominal cavity. We were then able to directly visualize the tumor. We were able to dissect it off the liver. There was a plane between the tumor and the liver. We were also able to dissect the transverse colon away from the stomach and duodenum using the harmonic scalpel. Once this was accomplished the right colon was able to be brought up into the wound without difficulty. We chose a site to divide the transverse colon distal to the tumor and open the mesentery at this point with the electrocautery. We placed a GIA 75 stapler across the colon at this point clamped it and fired it thereby dividing the colon between staple lines. We also chose a site on the terminal ileum to divide the small bowel. This area was opened with the electrocautery. A GI 75 stapler was placed across the small bowel this point clamped and fired thereby dividing the small bowel between staple lines. The mesentery to the right colon was then taken down using the LigaSure. The main artery to the right colon was then clamped near its base with Kelly clamps divided and ligated with 2-0 silk suture ligatures. Once this was accomplished the terminal  ileum and right colon were removed with the tumor. It was sent to pathology for further evaluation. The operative bed was examined and found to be hemostatic. The distal small bowel and transverse colon easily approximated each other. These 2 loops of bowel were held in place with a 2-0 silk stitch. A small enterotomy was made on the antimesenteric border of each limb of the small bowel and colon. A GIA  75 stapler was then placed down the appropriate limb of small bowel or colon clamped and fired thereby creating a nice widely patent enteroenterostomy. The common enterotomy was closed with a TA 60 stapler. The staple line was then imbricated with 2-0 silk Lembert stitches. The mesenteric defect was also closed with interrupted 2-0 silk stitches. Once this was accomplished we are able to drop the anastomosis back in the abdomen. The anastomosis was healthy without any tension. Gowns and gloves were then changed and new drapes applied. We then irrigated the abdomen copious amounts of saline. We generally inspected the rest of the abdomen no other abnormalities were noted. We closed the fascia of the anterior bowel wall with 2 #1 double-stranded PDS sutures. The subcutaneous tissue was irrigated with copious amounts of saline and Betadine. The skin incisions were then closed with staples. Sterile dressings were applied. The patient tolerated the procedure well. At the end of the case all needle sponge and instrument counts were correct. The patient was then awakened and taken to recovery in stable condition.  PLAN OF CARE: Admit to inpatient   PATIENT DISPOSITION:  PACU - hemodynamically stable.   Delay start of Pharmacological VTE agent (>24hrs) due to surgical blood loss or risk of bleeding: no

## 2013-01-02 NOTE — Progress Notes (Signed)
  Subjective: Dr. Carolynne Edouard has already seen him and discussed surgery.  He and his wife are there and ready for surgery.    Objective: Vital signs in last 24 hours: Temp:  [97.5 F (36.4 C)-97.9 F (36.6 C)] 97.5 F (36.4 C) (07/16 0631) Pulse Rate:  [67-80] 69 (07/16 0631) Resp:  [17-18] 18 (07/16 0631) BP: (108-126)/(60-80) 108/60 mmHg (07/16 0631) SpO2:  [98 %-100 %] 98 % (07/16 0631) Weight:  [78.79 kg (173 lb 11.2 oz)] 78.79 kg (173 lb 11.2 oz) (07/16 0419) Last BM Date: 01/01/13  Intake/Output from previous day:   Intake/Output this shift:    General appearance: alert, cooperative, no distress and he and his wife are fairly anxious. Resp: clear to auscultation bilaterally GI: soft, non-tender; bowel sounds normal; no masses,  no organomegaly  Lab Results:   Recent Labs  01/01/13 1834  WBC 6.8  6.7  HGB 10.0*  10.0*  HCT 32.2*  32.5*  PLT 271  257    BMET  Recent Labs  01/01/13 1834  NA 142  142  K 4.1  4.1  CL 106  107  CO2 29  29  GLUCOSE 85  86  BUN 15  16  CREATININE 1.12  1.12  CALCIUM 9.1  9.1   PT/INR  Recent Labs  01/01/13 1834 01/01/13 2250  LABPROT 12.5 13.8  INR 0.95 1.08     Recent Labs Lab 01/01/13 1834  AST 15  ALT 14  ALKPHOS 86  BILITOT 0.5  PROT 6.9  ALBUMIN 3.4*     Lipase  No results found for this basename: lipase     Studies/Results: X-ray Chest Pa And Lateral   01/01/2013   *RADIOLOGY REPORT*  Clinical Data: Preop evaluation.  Colon cancer.  CHEST - 2 VIEW  Comparison: 04/04/2012  Findings: Two views of the chest demonstrate clear lungs.  Heart and mediastinum are within normal limits.  The trachea is midline. Gas-filled loops of bowel in the upper abdomen.  Bony thorax is intact.  IMPRESSION: No acute cardiopulmonary disease.   Original Report Authenticated By: Richarda Overlie, M.D.    Medications: . antiseptic oral rinse  15 mL Mouth Rinse q12n4p  .  ceFAZolin (ANCEF) IV  2 g Intravenous On Call to OR    And  . metronidazole  500 mg Intravenous On Call to OR  . chlorhexidine  15 mL Mouth Rinse BID  . pramipexole  0.25 mg Oral QHS,MR X 1   Prior to Admission medications   Medication Sig Start Date End Date Taking? Authorizing Provider  ibuprofen (ADVIL,MOTRIN) 200 MG tablet Take 800 mg by mouth every 6 (six) hours as needed for pain.   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     LOS: 1 day    Maggie Dworkin 01/02/2013

## 2013-01-02 NOTE — Anesthesia Preprocedure Evaluation (Signed)
Anesthesia Evaluation  Patient identified by MRN, date of birth, ID band Patient awake    Reviewed: Allergy & Precautions, H&P , NPO status   Airway Mallampati: II  Neck ROM: full    Dental   Pulmonary          Cardiovascular     Neuro/Psych  Headaches,    GI/Hepatic Colon CA   Endo/Other    Renal/GU      Musculoskeletal   Abdominal   Peds  Hematology   Anesthesia Other Findings   Reproductive/Obstetrics                           Anesthesia Physical Anesthesia Plan  ASA: II  Anesthesia Plan: General   Post-op Pain Management:    Induction: Intravenous  Airway Management Planned: Oral ETT  Additional Equipment:   Intra-op Plan:   Post-operative Plan: Extubation in OR  Informed Consent: I have reviewed the patients History and Physical, chart, labs and discussed the procedure including the risks, benefits and alternatives for the proposed anesthesia with the patient or authorized representative who has indicated his/her understanding and acceptance.     Plan Discussed with: CRNA, Anesthesiologist and Surgeon  Anesthesia Plan Comments:         Anesthesia Quick Evaluation

## 2013-01-02 NOTE — Interval H&P Note (Signed)
History and Physical Interval Note:  01/02/2013 11:51 AM  Jeremiah Sanford  has presented today for surgery, with the diagnosis of Colon  The various methods of treatment have been discussed with the patient and family. After consideration of risks, benefits and other options for treatment, the patient has consented to  Procedure(s): LAPAROSCOPIC PROXIMAL COLECTOMY (N/A) as a surgical intervention .  The patient's history has been reviewed, patient examined, no change in status, stable for surgery.  I have reviewed the patient's chart and labs.  Questions were answered to the patient's satisfaction.     TOTH III,Maura Braaten S

## 2013-01-02 NOTE — Anesthesia Postprocedure Evaluation (Signed)
  Anesthesia Post-op Note  Patient: Jeremiah Sanford  Procedure(s) Performed: Procedure(s): LAPAROSCOPIC PROXIMAL COLECTOMY (N/A) PARTIAL COLECTOMY (Right)  Patient Location: PACU  Anesthesia Type:General  Level of Consciousness: awake  Airway and Oxygen Therapy: Patient Spontanous Breathing  Post-op Pain: mild  Post-op Assessment: Post-op Vital signs reviewed, Patient's Cardiovascular Status Stable, Respiratory Function Stable, Patent Airway, No signs of Nausea or vomiting and Pain level controlled  Post-op Vital Signs: stable  Complications: No apparent anesthesia complications

## 2013-01-02 NOTE — Progress Notes (Signed)
Plans for surgery later today noted.  I assume that he will be transferred to the surgical service following his operation. Please let us know if you would like [place] to continue to around on him. Otherwise, we will plan to see him socially.  I received a call from pathology today, that his colonoscopic biopsies of the proximal colonic mass were positive for invasive adenocarcinoma.  Florencia Reasons, M.D. [phone]

## 2013-01-02 NOTE — Progress Notes (Signed)
Risks and benefits of surgery as well as some of the technical aspects were discussed with the pt and he understands and wishes to proceed

## 2013-01-02 NOTE — Transfer of Care (Signed)
Immediate Anesthesia Transfer of Care Note  Patient: Jeremiah Sanford  Procedure(s) Performed: Procedure(s): LAPAROSCOPIC PROXIMAL COLECTOMY (N/A) PARTIAL COLECTOMY (Right)  Patient Location: PACU  Anesthesia Type:General  Level of Consciousness: awake  Airway & Oxygen Therapy: Patient Spontanous Breathing and Patient connected to face mask oxygen  Post-op Assessment: Report given to PACU RN and Post -op Vital signs reviewed and stable  Post vital signs: Reviewed and stable  Complications: No apparent anesthesia complications

## 2013-01-02 NOTE — Preoperative (Signed)
Beta Blockers   Reason not to administer Beta Blockers:Not Applicable 

## 2013-01-03 ENCOUNTER — Encounter (HOSPITAL_COMMUNITY): Payer: Self-pay | Admitting: Surgery

## 2013-01-03 LAB — BASIC METABOLIC PANEL
BUN: 7 mg/dL (ref 6–23)
CO2: 28 mEq/L (ref 19–32)
Calcium: 8.2 mg/dL — ABNORMAL LOW (ref 8.4–10.5)
Chloride: 102 mEq/L (ref 96–112)
Creatinine, Ser: 1.07 mg/dL (ref 0.50–1.35)
GFR calc Af Amer: >90 mL/min (ref >90)
GFR calc non Af Amer: 83 mL/min — ABNORMAL LOW (ref >90)
Glucose, Bld: 142 mg/dL — ABNORMAL HIGH (ref 70–99)
Potassium: 4.1 mEq/L (ref 3.5–5.1)
Sodium: 135 mEq/L (ref 135–145)

## 2013-01-03 LAB — CBC
HCT: 26.7 % — ABNORMAL LOW (ref 39.0–52.0)
Hemoglobin: 7.9 g/dL — ABNORMAL LOW (ref 13.0–17.0)
MCH: 21 pg — ABNORMAL LOW (ref 26.0–34.0)
MCHC: 29.6 g/dL — ABNORMAL LOW (ref 30.0–36.0)
MCV: 71 fL — ABNORMAL LOW (ref 78.0–100.0)
Platelets: 203 10*3/uL (ref 150–400)
RBC: 3.76 MIL/uL — ABNORMAL LOW (ref 4.22–5.81)
RDW: 16.6 % — ABNORMAL HIGH (ref 11.5–15.5)
WBC: 10.9 10*3/uL — ABNORMAL HIGH (ref 4.0–10.5)

## 2013-01-03 MED ORDER — SODIUM CHLORIDE 0.9 % IJ SOLN: 9.0000 mL | INTRAMUSCULAR | Status: DC | PRN

## 2013-01-03 MED ORDER — MORPHINE SULFATE (PF) 1 MG/ML IV SOLN
INTRAVENOUS | Status: DC
  Administered 2013-01-04: 2 mg via INTRAVENOUS
  Administered 2013-01-04: 01:00:00 via INTRAVENOUS

## 2013-01-03 MED ORDER — NALOXONE HCL 0.4 MG/ML IJ SOLN: 0.4000 mg | INTRAMUSCULAR | Status: DC | PRN

## 2013-01-03 NOTE — Progress Notes (Signed)
1 Day Post-Op  Subjective: Complains of abdominal pain but he is glad surgery is over  Objective: Vital signs in last 24 hours: Temp:  [97.6 F (36.4 C)-98.8 F (37.1 C)] 98.4 F (36.9 C) (07/17 0630) Pulse Rate:  [99-111] 104 (07/17 0630) Resp:  [12-26] 17 (07/17 0758) BP: (111-144)/(58-87) 111/75 mmHg (07/17 0630) SpO2:  [92 %-100 %] 93 % (07/17 0758) Last BM Date: 01/01/13  Intake/Output from previous day: 07/16 0701 - 07/17 0700 In: 4472.9 [I.V.:3972.9; IV Piggyback:500] Out: 2150 [Urine:2050; Blood:100] Intake/Output this shift:    Resp: clear to auscultation bilaterally GI: appropriately tender. quiet. incision looks good  Lab Results:   Recent Labs  01/01/13 1834 01/03/13 0530  WBC 6.8  6.7 10.9*  HGB 10.0*  10.0* 7.9*  HCT 32.2*  32.5* 26.7*  PLT 271  257 203   BMET  Recent Labs  01/01/13 1834 01/03/13 0530  NA 142  142 135  K 4.1  4.1 4.1  CL 106  107 102  CO2 29  29 28   GLUCOSE 85  86 142*  BUN 15  16 7   CREATININE 1.12  1.12 1.07  CALCIUM 9.1  9.1 8.2*   PT/INR  Recent Labs  01/01/13 1834 01/01/13 2250  LABPROT 12.5 13.8  INR 0.95 1.08   ABG No results found for this basename: PHART, PCO2, PO2, HCO3,  in the last 72 hours  Studies/Results: X-ray Chest Pa And Lateral   01/01/2013   *RADIOLOGY REPORT*  Clinical Data: Preop evaluation.  Colon cancer.  CHEST - 2 VIEW  Comparison: 04/04/2012  Findings: Two views of the chest demonstrate clear lungs.  Heart and mediastinum are within normal limits.  The trachea is midline. Gas-filled loops of bowel in the upper abdomen.  Bony thorax is intact.  IMPRESSION: No acute cardiopulmonary disease.   Original Report Authenticated By: Richarda Overlie, M.D.    Anti-infectives: Anti-infectives   Start     Dose/Rate Route Frequency Ordered Stop   01/02/13 0600  ceFAZolin (ANCEF) IVPB 2 g/50 mL premix     2 g 100 mL/hr over 30 Minutes Intravenous On call to O.R. 01/01/13 2147 01/02/13 1255   01/02/13 0600  metroNIDAZOLE (FLAGYL) IVPB 500 mg     500 mg 100 mL/hr over 60 Minutes Intravenous On call to O.R. 01/01/13 2147 01/02/13 1311      Assessment/Plan: s/p Procedure(s): LAPAROSCOPIC PROXIMAL COLECTOMY (N/A) PARTIAL COLECTOMY (Right) continue bowel rest until bowel function starts to return OOB to chair today Leave foley in another day  LOS: 2 days    TOTH III,Eligha Kmetz S 01/03/2013

## 2013-01-03 NOTE — Progress Notes (Signed)
Social visit (no charge)  Patient doing fine postop. Upper report reviewed.  Pathology results pending on surgical specimen.  Discussion with patient and wife regarding epidemiology and pathogenesis of colon cancer, signs and symptoms of that disease, his clinical history, etc.  I have asked that the patient be transferred to the service of Dr. Royston Sinner.  Please call if we can be of further assistance in this patient's care.  Florencia Reasons, M.D. [phone]

## 2013-01-04 ENCOUNTER — Inpatient Hospital Stay (HOSPITAL_COMMUNITY): Payer: BC Managed Care – PPO

## 2013-01-04 LAB — CBC
HCT: 27.1 % — ABNORMAL LOW (ref 39.0–52.0)
Hemoglobin: 8 g/dL — ABNORMAL LOW (ref 13.0–17.0)
MCH: 20.9 pg — ABNORMAL LOW (ref 26.0–34.0)
MCHC: 29.5 g/dL — ABNORMAL LOW (ref 30.0–36.0)
MCV: 70.9 fL — ABNORMAL LOW (ref 78.0–100.0)
Platelets: 224 10*3/uL (ref 150–400)
RBC: 3.82 MIL/uL — ABNORMAL LOW (ref 4.22–5.81)
RDW: 16.8 % — ABNORMAL HIGH (ref 11.5–15.5)
WBC: 10.7 10*3/uL — ABNORMAL HIGH (ref 4.0–10.5)

## 2013-01-04 LAB — BASIC METABOLIC PANEL
BUN: 6 mg/dL (ref 6–23)
CO2: 27 mEq/L (ref 19–32)
Calcium: 8.7 mg/dL (ref 8.4–10.5)
Chloride: 102 mEq/L (ref 96–112)
Creatinine, Ser: 0.77 mg/dL (ref 0.50–1.35)
GFR calc Af Amer: >90 mL/min (ref >90)
GFR calc non Af Amer: >90 mL/min (ref >90)
Glucose, Bld: 135 mg/dL — ABNORMAL HIGH (ref 70–99)
Potassium: 4.4 mEq/L (ref 3.5–5.1)
Sodium: 135 mEq/L (ref 135–145)

## 2013-01-04 MED ORDER — SODIUM CHLORIDE 0.9 % IJ SOLN: 9.0000 mL | INTRAMUSCULAR | Status: DC | PRN

## 2013-01-04 MED ORDER — KETOROLAC TROMETHAMINE 15 MG/ML IJ SOLN
15.0000 mg | Freq: Once | INTRAMUSCULAR | Status: AC
  Administered 2013-01-04: 15 mg via INTRAVENOUS
  Filled 2013-01-04: qty 1

## 2013-01-04 MED ORDER — ONDANSETRON HCL 4 MG/2ML IJ SOLN: 4.0000 mg | Freq: Four times a day (QID) | INTRAMUSCULAR | Status: DC | PRN

## 2013-01-04 MED ORDER — DIPHENHYDRAMINE HCL 12.5 MG/5ML PO ELIX: 12.5000 mg | ORAL_SOLUTION | Freq: Four times a day (QID) | ORAL | Status: DC | PRN

## 2013-01-04 MED ORDER — KETOROLAC TROMETHAMINE 30 MG/ML IJ SOLN
30.0000 mg | Freq: Once | INTRAMUSCULAR | Status: AC
  Administered 2013-01-04: 30 mg via INTRAVENOUS
  Filled 2013-01-04: qty 1

## 2013-01-04 MED ORDER — NALOXONE HCL 0.4 MG/ML IJ SOLN: 0.4000 mg | INTRAMUSCULAR | Status: DC | PRN

## 2013-01-04 MED ORDER — HYDROMORPHONE 0.3 MG/ML IV SOLN
INTRAVENOUS | Status: DC
  Administered 2013-01-04: 13:00:00 via INTRAVENOUS
  Administered 2013-01-04: 0.799 mg via INTRAVENOUS
  Administered 2013-01-04: 1.19 mg via INTRAVENOUS
  Administered 2013-01-05: 0.5999 mg via INTRAVENOUS
  Administered 2013-01-05: 0.3 mg via INTRAVENOUS
  Administered 2013-01-05: 0.4 mg via INTRAVENOUS
  Administered 2013-01-06: 0.2 mg via INTRAVENOUS
  Administered 2013-01-06: 0.3 mg via INTRAVENOUS
  Filled 2013-01-04: qty 25

## 2013-01-04 MED ORDER — HYDROMORPHONE 0.3 MG/ML IV SOLN: INTRAVENOUS | Status: DC

## 2013-01-04 MED ORDER — DIPHENHYDRAMINE HCL 50 MG/ML IJ SOLN: 12.5000 mg | Freq: Four times a day (QID) | INTRAMUSCULAR | Status: DC | PRN

## 2013-01-04 MED ORDER — ACETAMINOPHEN 10 MG/ML IV SOLN
1000.0000 mg | Freq: Four times a day (QID) | INTRAVENOUS | Status: AC
  Administered 2013-01-04 (×4): 1000 mg via INTRAVENOUS
  Filled 2013-01-04 (×5): qty 100

## 2013-01-04 NOTE — Progress Notes (Signed)
2 Days Post-Op  Subjective: He has a terrible day yesterday and last PM.  Pain, nausea, headache, and high CO2 alarm, with MS PCA and Phenergan. (12.5mg  IV).  He was sleeping when I came in.  Currently nausea is better, headache a little better with Toradol.  He still feels pretty bad, hasn't even wanted ice chips.  Objective: Vital signs in last 24 hours: Temp:  [97.9 F (36.6 C)-98.4 F (36.9 C)] 97.9 F (36.6 C) (07/18 0530) Pulse Rate:  [78-105] 89 (07/18 0530) Resp:  [11-22] 22 (07/18 0530) BP: (116-124)/(67-74) 120/69 mmHg (07/18 0530) SpO2:  [93 %-100 %] 100 % (07/18 0530) Last BM Date: 01/01/13 Ice chips only, Afebrile, VSS No labs this AM Film this AM OK Intake/Output from previous day: 07/17 0701 - 07/18 0700 In: 3998.4 [I.V.:3998.4] Out: 1700 [Urine:1700] Intake/Output this shift:    General appearance: alert, no distress and just feels really bad all over. Resp: clear to auscultation bilaterally GI: tender, no distension, no bowel sounds.  Right now nausea is a better.  Lab Results:   Recent Labs  01/01/13 1834 01/03/13 0530  WBC 6.8  6.7 10.9*  HGB 10.0*  10.0* 7.9*  HCT 32.2*  32.5* 26.7*  PLT 271  257 203    BMET  Recent Labs  01/01/13 1834 01/03/13 0530  NA 142  142 135  K 4.1  4.1 4.1  CL 106  107 102  CO2 29  29 28   GLUCOSE 85  86 142*  BUN 15  16 7   CREATININE 1.12  1.12 1.07  CALCIUM 9.1  9.1 8.2*   PT/INR  Recent Labs  01/01/13 1834 01/01/13 2250  LABPROT 12.5 13.8  INR 0.95 1.08     Recent Labs Lab 01/01/13 1834  AST 15  ALT 14  ALKPHOS 86  BILITOT 0.5  PROT 6.9  ALBUMIN 3.4*     Lipase  No results found for this basename: lipase     Studies/Results: Dg Abd 1 View  01/04/2013   *RADIOLOGY REPORT*  Clinical Data: Status post colectomy; assess for ileus.  ABDOMEN - 1 VIEW  Comparison: CT of the abdomen and pelvis performed 12/25/2012  Findings: Visualized small and large bowel loops are grossly  unremarkable in appearance; there is no evidence of bowel dilatation to suggest obstruction or ileus.  The stomach is partially filled with air.  Free intra-abdominal air is likely present given the patient's recent postoperative state, but is not well seen on supine radiograph.  A bowel suture line is noted at the right mid abdomen.  Scattered skin staples are noted about the abdomen and pelvis.  No acute osseous abnormalities are seen.  IMPRESSION: No evidence for bowel obstruction or ileus; visualized bowel loops are grossly unremarkable in appearance.   Original Report Authenticated By: Tonia Ghent, M.D.    Medications: . alvimopan  12 mg Oral BID  . antiseptic oral rinse  15 mL Mouth Rinse q12n4p  . chlorhexidine  15 mL Mouth Rinse BID  . heparin subcutaneous  5,000 Units Subcutaneous Q8H  . morphine   Intravenous Q4H  . pramipexole  0.25 mg Oral QHS,MR X 1   . dextrose 5 % and 0.45 % NaCl with KCl 20 mEq/L 125 mL/hr at 01/04/13 0130   Assessment/Plan Colon Mass with obstruction S/p LAPAROSCOPIC PROXIMAL COLECTOMY PARTIAL COLECTOMY (Right), 01/02/2013, Caleen Essex III, MD Hx of Migraines (none for a few months, He has Imitrex at home for this)  Hx of restless leg  syndrome  Hx of nephrolithiasis  Body mass index is 28.05    Plan:  He is on low dose MS PCA.  i will leave that for now, see how he does.  Add IV tylenol, leave foley in for today also.  Check labs his H/H dropped post op.  I will check on him later.  10:30 AM vomiting again, pain with vomiting still has headache. (Migraine) I am going to place an NG and put him on suction, change PCA to dilaudid.  Labs order at 7:40 are not ready yet.   LOS: 3 days    Reinette Cuneo 01/04/2013

## 2013-01-05 LAB — BASIC METABOLIC PANEL
BUN: 5 mg/dL — ABNORMAL LOW (ref 6–23)
CO2: 31 mEq/L (ref 19–32)
Calcium: 8.8 mg/dL (ref 8.4–10.5)
Chloride: 107 mEq/L (ref 96–112)
Creatinine, Ser: 0.86 mg/dL (ref 0.50–1.35)
GFR calc Af Amer: >90 mL/min (ref >90)
GFR calc non Af Amer: >90 mL/min (ref >90)
Glucose, Bld: 115 mg/dL — ABNORMAL HIGH (ref 70–99)
Potassium: 4.2 mEq/L (ref 3.5–5.1)
Sodium: 141 mEq/L (ref 135–145)

## 2013-01-05 LAB — CBC
HCT: 25.8 % — ABNORMAL LOW (ref 39.0–52.0)
Hemoglobin: 7.7 g/dL — ABNORMAL LOW (ref 13.0–17.0)
MCH: 21.3 pg — ABNORMAL LOW (ref 26.0–34.0)
MCHC: 29.8 g/dL — ABNORMAL LOW (ref 30.0–36.0)
MCV: 71.5 fL — ABNORMAL LOW (ref 78.0–100.0)
Platelets: 225 10*3/uL (ref 150–400)
RBC: 3.61 MIL/uL — ABNORMAL LOW (ref 4.22–5.81)
RDW: 16.7 % — ABNORMAL HIGH (ref 11.5–15.5)
WBC: 6.6 10*3/uL (ref 4.0–10.5)

## 2013-01-05 MED ORDER — ACETAMINOPHEN 10 MG/ML IV SOLN
1000.0000 mg | Freq: Four times a day (QID) | INTRAVENOUS | Status: AC
  Administered 2013-01-05 – 2013-01-06 (×3): 1000 mg via INTRAVENOUS
  Filled 2013-01-05 (×5): qty 100

## 2013-01-05 NOTE — Progress Notes (Signed)
3 Days Post-Op  Subjective: He feels a little better today.  Headache is better, nausea is mostly better.  He still had some with NG in about 400 in the cannister.  I irrigated it and it is working.  Objective: Vital signs in last 24 hours: Temp:  [97.7 F (36.5 C)-99.1 F (37.3 C)] 98.2 F (36.8 C) (07/19 0517) Pulse Rate:  [78-102] 102 (07/19 0517) Resp:  [10-16] 16 (07/19 0800) BP: (123-134)/(66-83) 129/81 mmHg (07/19 0517) SpO2:  [96 %-100 %] 96 % (07/19 0800) Last BM Date: 01/01/13 200 from the NG, 4400 Urine output 3400 IV fluid Afebrile, TM 99.1 BMp is ok H/H is down Intake/Output from previous day: 07/18 0701 - 07/19 0700 In: 3400 [I.V.:3000; IV Piggyback:400] Out: 4600 [Urine:4400; Emesis/NG output:200] Intake/Output this shift:    General appearance: alert, no distress and worn out, still feels pretty bad. Resp: clear to auscultation bilaterally GI: soft, no BS, NG in place and working.  dressing is dry.  Lab Results:   Recent Labs  01/04/13 0734 01/05/13 0600  WBC 10.7* 6.6  HGB 8.0* 7.7*  HCT 27.1* 25.8*  PLT 224 225    BMET  Recent Labs  01/04/13 0734 01/05/13 0600  NA 135 141  K 4.4 4.2  CL 102 107  CO2 27 31  GLUCOSE 135* 115*  BUN 6 5*  CREATININE 0.77 0.86  CALCIUM 8.7 8.8   PT/INR No results found for this basename: LABPROT, INR,  in the last 72 hours   Recent Labs Lab 01/01/13 1834  AST 15  ALT 14  ALKPHOS 86  BILITOT 0.5  PROT 6.9  ALBUMIN 3.4*     Lipase  No results found for this basename: lipase     Studies/Results: Dg Abd 1 View  01/04/2013   *RADIOLOGY REPORT*  Clinical Data: Status post colectomy; assess for ileus.  ABDOMEN - 1 VIEW  Comparison: CT of the abdomen and pelvis performed 12/25/2012  Findings: Visualized small and large bowel loops are grossly unremarkable in appearance; there is no evidence of bowel dilatation to suggest obstruction or ileus.  The stomach is partially filled with air.  Free  intra-abdominal air is likely present given the patient's recent postoperative state, but is not well seen on supine radiograph.  A bowel suture line is noted at the right mid abdomen.  Scattered skin staples are noted about the abdomen and pelvis.  No acute osseous abnormalities are seen.  IMPRESSION: No evidence for bowel obstruction or ileus; visualized bowel loops are grossly unremarkable in appearance.   Original Report Authenticated By: Tonia Ghent, M.D.    Medications: . alvimopan  12 mg Oral BID  . antiseptic oral rinse  15 mL Mouth Rinse q12n4p  . chlorhexidine  15 mL Mouth Rinse BID  . heparin subcutaneous  5,000 Units Subcutaneous Q8H  . HYDROmorphone PCA 0.3 mg/mL   Intravenous Q4H  . 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 - MARGINS NOT INVOLVED. - TWENTY BENIGN LYMPH NODES (0/20). - BENIGN APPENDIX WITH FIBROUS OBLITERATION OF THE LUMEN. Hx of Migraines (none for a few months, He has Imitrex at home for this)  Hx of restless leg syndrome  Hx of nephrolithiasis  Body mass index is 28.05  Anemia  Plan: I have had his wife let some light in the room, will d/c foley and get him moving some.  Continue IV tylenol.  LOS: 4 days    Charnette Younkin 01/05/2013

## 2013-01-05 NOTE — Plan of Care (Signed)
Problem: Phase I Progression Outcomes Goal: Incision/dressings dry and intact Outcome: Not Met (add Reason) Honeycomb dressing noted to have old drng. Other 2x2 dsgs x 3 clean, dry and intact.  No bowel sounds noted presently.  Pt has occasional nausea and 200cc emesis x 1.  Ng tube checked for placement and wall suction noted to be working correctly.

## 2013-01-05 NOTE — Progress Notes (Signed)
Feels better. No flatus.   Asleep easily arousable cta b/l Reg Soft, some distension. HypoBS. Dressing c/d/i  Keep ng today for decompression; will d/c in am Ambulate pulm toilet Monitor Hgb- stable.  Mary Sella. Andrey Campanile, MD, FACS General, Bariatric, & Minimally Invasive Surgery [facility] Surgery, [place]

## 2013-01-06 NOTE — Progress Notes (Signed)
4 Days Post-Op  Subjective: Feels better, NG with minimal drainage, no further nausea, up walking and over all feels better.  Some flatus  Objective: Vital signs in last 24 hours: Temp:  [97.7 F (36.5 C)-99 F (37.2 C)] 97.7 F (36.5 C) (07/20 0606) Pulse Rate:  [77-88] 77 (07/20 0606) Resp:  [13-16] 16 (07/20 0800) BP: (115-131)/(69-81) 120/76 mmHg (07/20 0606) SpO2:  [94 %-98 %] 98 % (07/20 0800) FiO2 (%):  [94 %-96 %] 94 % (07/20 0000) Last BM Date: 01/01/13 No intake recorded yesterday.  100 ml from NG Afebrile, VSS Labs OK yesterday Intake/Output from previous day: 07/19 0701 - 07/20 0700 In: -  Out: 3700 [Urine:3600; Emesis/NG output:100] Intake/Output this shift:    General appearance: alert, cooperative and no distress Resp: clear to auscultation bilaterally GI: soft still pretty tender.  Wounds ok, BS hypoactive, I didn't hear much.  + flatus  Lab Results:   Recent Labs  01/04/13 0734 01/05/13 0600  WBC 10.7* 6.6  HGB 8.0* 7.7*  HCT 27.1* 25.8*  PLT 224 225    BMET  Recent Labs  01/04/13 0734 01/05/13 0600  NA 135 141  K 4.4 4.2  CL 102 107  CO2 27 31  GLUCOSE 135* 115*  BUN 6 5*  CREATININE 0.77 0.86  CALCIUM 8.7 8.8   PT/INR No results found for this basename: LABPROT, INR,  in the last 72 hours   Recent Labs Lab 01/01/13 1834  AST 15  ALT 14  ALKPHOS 86  BILITOT 0.5  PROT 6.9  ALBUMIN 3.4*     Lipase  No results found for this basename: lipase     Studies/Results: No results found.  Medications: . acetaminophen  1,000 mg Intravenous Q6H  . alvimopan  12 mg Oral BID  . antiseptic oral rinse  15 mL Mouth Rinse q12n4p  . chlorhexidine  15 mL Mouth Rinse BID  . heparin subcutaneous  5,000 Units Subcutaneous Q8H  . HYDROmorphone PCA 0.3 mg/mL   Intravenous Q4H  . 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  - MARGINS NOT INVOLVED.  - TWENTY BENIGN LYMPH NODES (0/20).  - BENIGN APPENDIX WITH FIBROUS OBLITERATION OF THE LUMEN.  Hx of Migraines (none for a few months, He has Imitrex at home for this)  Hx of restless leg syndrome  Hx of nephrolithiasis  Body mass index is 28.05  Anemia   Plan:  Mobilize more, I pulled NG, ice chips today.  LOS: 5 days    Thoams Siefert 01/06/2013

## 2013-01-06 NOTE — Progress Notes (Signed)
Ng out. +flatus Looks better Soft, expected TTP. Incisions c/d/i   Ice chips, sips of clears Ambulate Discussed path report with pt and wife. Gave copy. T3N0. Explained that he will be presented to at GI tumor board. Right now no indication for chemo but may be considered for clinical trial. Discussed surveillance for recurrence.   Mary Sella. Andrey Campanile, MD, FACS General, Bariatric, & Minimally Invasive Surgery [facility] Surgery, [place]

## 2013-01-07 ENCOUNTER — Encounter (HOSPITAL_COMMUNITY): Payer: Self-pay | Admitting: General Surgery

## 2013-01-07 LAB — CBC
HCT: 29.6 % — ABNORMAL LOW (ref 39.0–52.0)
Hemoglobin: 8.9 g/dL — ABNORMAL LOW (ref 13.0–17.0)
MCH: 21 pg — ABNORMAL LOW (ref 26.0–34.0)
MCHC: 30.1 g/dL (ref 30.0–36.0)
MCV: 70 fL — ABNORMAL LOW (ref 78.0–100.0)
Platelets: 251 10*3/uL (ref 150–400)
RBC: 4.23 MIL/uL (ref 4.22–5.81)
RDW: 17 % — ABNORMAL HIGH (ref 11.5–15.5)
WBC: 4.7 10*3/uL (ref 4.0–10.5)

## 2013-01-07 LAB — BASIC METABOLIC PANEL
BUN: 12 mg/dL (ref 6–23)
CO2: 27 mEq/L (ref 19–32)
Calcium: 9.6 mg/dL (ref 8.4–10.5)
Chloride: 104 mEq/L (ref 96–112)
Creatinine, Ser: 0.85 mg/dL (ref 0.50–1.35)
GFR calc Af Amer: >90 mL/min (ref >90)
GFR calc non Af Amer: >90 mL/min (ref >90)
Glucose, Bld: 103 mg/dL — ABNORMAL HIGH (ref 70–99)
Potassium: 4.1 mEq/L (ref 3.5–5.1)
Sodium: 140 mEq/L (ref 135–145)

## 2013-01-07 MED ORDER — HYDROMORPHONE HCL PF 1 MG/ML IJ SOLN: 0.5000 mg | INTRAMUSCULAR | Status: DC | PRN

## 2013-01-07 MED ORDER — OXYCODONE-ACETAMINOPHEN 5-325 MG/5ML PO SOLN: 10.0000 mL | ORAL | Status: DC | PRN

## 2013-01-07 NOTE — Progress Notes (Signed)
I have seen and examined the patient and agree with the assessment and plans.  Dimitry Holsworth A. Annalyse Langlais  MD, FACS  

## 2013-01-07 NOTE — Progress Notes (Signed)
5 Days Post-Op  Subjective: He feels better had some wet flatus this Am.  Wants to get rid of PCA  Objective: Vital signs in last 24 hours: Temp:  [98 F (36.7 C)-98.4 F (36.9 C)] 98.1 F (36.7 C) (07/21 5784) Pulse Rate:  [70-77] 70 (07/21 0608) Resp:  [9-16] 16 (07/21 0608) BP: (118-133)/(78-95) 118/78 mmHg (07/21 0608) SpO2:  [96 %-99 %] 98 % (07/21 0608) Last BM Date: 01/01/13 Afebrile, VSS Labs OK Diet: ice chips Intake/Output from previous day: 07/20 0701 - 07/21 0700 In: 750 [I.V.:750] Out: 400 [Urine:400] Intake/Output this shift:    General appearance: alert, cooperative and no distress Resp: clear to auscultation bilaterally GI: soft, less tender, few hypoactive bowel sounds.  Incision looks great.  Lab Results:   Recent Labs  01/05/13 0600 01/07/13 0600  WBC 6.6 4.7  HGB 7.7* 8.9*  HCT 25.8* 29.6*  PLT 225 251    BMET  Recent Labs  01/05/13 0600 01/07/13 0600  NA 141 140  K 4.2 4.1  CL 107 104  CO2 31 27  GLUCOSE 115* 103*  BUN 5* 12  CREATININE 0.86 0.85  CALCIUM 8.8 9.6   PT/INR No results found for this basename: LABPROT, INR,  in the last 72 hours   Recent Labs Lab 01/01/13 1834  AST 15  ALT 14  ALKPHOS 86  BILITOT 0.5  PROT 6.9  ALBUMIN 3.4*     Lipase  No results found for this basename: lipase     Studies/Results: No results found.  Medications: . alvimopan  12 mg Oral BID  . antiseptic oral rinse  15 mL Mouth Rinse q12n4p  . chlorhexidine  15 mL Mouth Rinse BID  . heparin subcutaneous  5,000 Units Subcutaneous Q8H  . HYDROmorphone PCA 0.3 mg/mL   Intravenous Q4H  . 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  - MARGINS NOT INVOLVED.  - TWENTY BENIGN LYMPH NODES  (0/20).  - BENIGN APPENDIX WITH FIBROUS OBLITERATION OF THE LUMEN.  Hx of Migraines (none for a few months, He has Imitrex at home for this)  Hx of restless leg syndrome  Hx of nephrolithiasis  Body mass index is 28.05  Anemia   Plan:  Clear as tolerated, d/c PCA, mobilize   LOS: 6 days    Errika Narvaiz 01/07/2013

## 2013-01-08 MED ORDER — SIMETHICONE 80 MG PO CHEW
80.0000 mg | CHEWABLE_TABLET | Freq: Four times a day (QID) | ORAL | Status: DC | PRN
  Administered 2013-01-08 (×3): 80 mg via ORAL
  Filled 2013-01-08 (×3): qty 1

## 2013-01-08 NOTE — Progress Notes (Signed)
6 Days Post-Op  Subjective: Pt is feeling much better. He is walking the halls this am. He has had a couple bm's. Tolerating clears  Objective: Vital signs in last 24 hours: Temp:  [97.8 F (36.6 C)-98.5 F (36.9 C)] 98.2 F (36.8 C) (07/22 0555) Pulse Rate:  [73-103] 80 (07/22 0555) Resp:  [16-19] 16 (07/22 0555) BP: (114-127)/(75-83) 114/78 mmHg (07/22 0555) SpO2:  [96 %-100 %] 97 % (07/22 0555) Last BM Date: 01/07/13  Intake/Output from previous day: 07/21 0701 - 07/22 0700 In: 820 [I.V.:820] Out: 400 [Urine:400] Intake/Output this shift:    GI: soft, minimal tenderness. good bs. incision looks great  Lab Results:   Recent Labs  01/07/13 0600  WBC 4.7  HGB 8.9*  HCT 29.6*  PLT 251   BMET  Recent Labs  01/07/13 0600  NA 140  K 4.1  CL 104  CO2 27  GLUCOSE 103*  BUN 12  CREATININE 0.85  CALCIUM 9.6   PT/INR No results found for this basename: LABPROT, INR,  in the last 72 hours ABG No results found for this basename: PHART, PCO2, PO2, HCO3,  in the last 72 hours  Studies/Results: No results found.  Anti-infectives: Anti-infectives   Start     Dose/Rate Route Frequency Ordered Stop   01/02/13 0600  ceFAZolin (ANCEF) IVPB 2 g/50 mL premix     2 g 100 mL/hr over 30 Minutes Intravenous On call to O.R. 01/01/13 2147 01/02/13 1255   01/02/13 0600  metroNIDAZOLE (FLAGYL) IVPB 500 mg     500 mg 100 mL/hr over 60 Minutes Intravenous On call to O.R. 01/01/13 2147 01/02/13 1311      Assessment/Plan: s/p Procedure(s): LAPAROSCOPIC PROXIMAL COLECTOMY (N/A) PARTIAL COLECTOMY (Right) Advance diet to full liquids Add simethicone for gas pains ambulate  LOS: 7 days    TOTH Sanford,Jeremiah Dirocco S 01/08/2013

## 2013-01-09 ENCOUNTER — Encounter (HOSPITAL_COMMUNITY): Payer: Self-pay | Admitting: General Surgery

## 2013-01-09 DIAGNOSIS — Z87442 Personal history of urinary calculi: Secondary | ICD-10-CM

## 2013-01-09 DIAGNOSIS — K6389 Other specified diseases of intestine: Secondary | ICD-10-CM

## 2013-01-09 DIAGNOSIS — G2581 Restless legs syndrome: Secondary | ICD-10-CM

## 2013-01-09 DIAGNOSIS — Z8669 Personal history of other diseases of the nervous system and sense organs: Secondary | ICD-10-CM

## 2013-01-09 HISTORY — DX: Other specified diseases of intestine: K63.89

## 2013-01-09 HISTORY — DX: Personal history of other diseases of the nervous system and sense organs: Z86.69

## 2013-01-09 HISTORY — DX: Restless legs syndrome: G25.81

## 2013-01-09 HISTORY — DX: Personal history of urinary calculi: Z87.442

## 2013-01-09 MED ORDER — OXYCODONE-ACETAMINOPHEN 5-325 MG PO TABS: 1.0000 | ORAL_TABLET | ORAL | Status: DC | PRN

## 2013-01-09 MED ORDER — IBUPROFEN 600 MG PO TABS: 600.0000 mg | ORAL_TABLET | Freq: Four times a day (QID) | ORAL | Status: DC | PRN

## 2013-01-09 MED ORDER — IBUPROFEN 200 MG PO TABS: ORAL_TABLET | ORAL | Status: DC

## 2013-01-09 MED ORDER — ACETAMINOPHEN 325 MG PO TABS: 650.0000 mg | ORAL_TABLET | Freq: Four times a day (QID) | ORAL | Status: DC | PRN

## 2013-01-09 NOTE — Discharge Summary (Signed)
Physician Discharge Summary  Patient ID: Jeremiah Sanford MRN: [id number] DOB/AGE: 09/29/1965 47 y.o. PCP: Dr. Manus Gunning GI: Dr. 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  - MARGINS NOT INVOLVED.  - TWENTY BENIGN LYMPH NODES (0/20).  - BENIGN APPENDIX WITH FIBROUS OBLITERATION OF THE LUMEN.   Hx of Migraines (none for a few months, He has Imitrex at home for this)  Hx of restless leg syndrome  Hx of nephrolithiasis  Body mass index is 28.05   Principal Problem:   Mass of colon Active Problems:   Hx of migraine headaches   Restless leg syndrome   History of nephrolithiasis   PROCEDURES: S/p LAPAROSCOPIC PROXIMAL COLECTOMY PARTIAL COLECTOMY (Right), 01/02/2013, [name], MD    Hospital Course: [name] is a 47 yo man who has been having epigastric pain where he sometimes would hear "gurgling" sounds in his abdomen that would dissipate at times and he would sometimes feel like stool was "moving through my colon" and the pain would dissipate. Denies any trouble moving his bowels and and reports normal formed stools. Had one episode of bleeding about 4 months ago after defecation that resolved after 1-2 days. Recently found to be anemic with Hgb 9.9 and CT was done by his PCP, Dr. Manus Gunning and a large mass was noted in the ascending colon. A colonoscopy done today showed an obstructing fungating mass in the ascending colon. A lumen proximal to the mass could not be seen and the colonoscope did not traverse the lesion. The distal margin was tattooed with inkspot. Patient is being admitted for IV hydration  and management of his obstructing colon malignancy, which will require surgery. He was admitted by Dr. Bosie Clos after his colonoscopy and seen that evening by Dr. Donell Beers.  She agreed with Dr. Marge Duncans recommendation that he have surgery.  The following AM he was seen by Dr. Carolynne Edouard and taken to the OR later that day. He underwent above noted procedure and did well.  His first post op day he was doing well, but had problems with pain and developed nausea, vomiting and headache much later in the day.  His PCA had to be decreased because of sedation, it was ultimately changed from morphine to Dilaudid.  His headache was controlled with IV tylenol and Toradol.  We never got good control of his nausea till an NG tube was placed.  It never drained much but his nausea did improve. His ileus slowly resolved, his headache was relieved, with his nausea and vomiting.  His diet has been advanced, he is having bowel movements, he isn't hurting much now, taking almost nothing for pain, ambulating with difficulty, and is ready for d/c home today. We have removed staples and steri stripped the incisions.  He will follow up with Dr. Carolynne Edouard in 2 weeks and we will be send him to oncology after that visit.  Condition on d/c:  Improved   Disposition: Home     Medication List         acetaminophen 325 MG tablet  Commonly known as:  TYLENOL  Take 2 tablets (650 mg total) by mouth every 6 (six) hours as needed (fever > 101).  ibuprofen 200 MG tablet  Commonly known as:  ADVIL,MOTRIN  You can take 2-3 tablets every 6 hours as needed for pain.     oxyCODONE-acetaminophen 5-325 MG per tablet  Commonly known as:  PERCOCET/ROXICET  Take 1-2 tablets by mouth every 4 (four) hours as needed.       Follow-up Information   Follow up with [name], MD. Schedule an appointment as soon as possible for a visit in 2 weeks.   Contact information:   [address] [place] [address] [phone]        Signed: Sherrie George 01/09/2013, 10:53 AM

## 2013-01-09 NOTE — Progress Notes (Signed)
Pt being discharged at this time.  Previewed after visit summary with patient. Wife at bedside with patient.

## 2013-01-09 NOTE — Progress Notes (Signed)
I have seen and examined the patient and agree with the assessment and plans.  Jera Headings A. Willys Salvino  MD, FACS  

## 2013-01-09 NOTE — Progress Notes (Signed)
7 Days Post-Op  Subjective: He's doing very well, had enough of the bed.  Plan a regular diet and home later today.  Objective: Vital signs in last 24 hours: Temp:  [97.5 F (36.4 C)-98.3 F (36.8 C)] 97.5 F (36.4 C) (07/23 0625) Pulse Rate:  [85-87] 85 (07/23 0625) Resp:  [18-19] 18 (07/23 0625) BP: (99-107)/(63-76) 99/67 mmHg (07/23 0625) SpO2:  [98 %-99 %] 98 % (07/23 0625) Last BM Date: 01/08/13 Nothing PO recorded Full liquid diet Afebrile, VSS,  Intake/Output from previous day:   Intake/Output this shift:    General appearance: alert, cooperative and no distress Resp: clear to auscultation bilaterally GI: soft, much less tender.  Incision looks fine.  I took out staples and steri stripped the incisions.  +BS, and BM  Lab Results:   Recent Labs  01/07/13 0600  WBC 4.7  HGB 8.9*  HCT 29.6*  PLT 251    BMET  Recent Labs  01/07/13 0600  NA 140  K 4.1  CL 104  CO2 27  GLUCOSE 103*  BUN 12  CREATININE 0.85  CALCIUM 9.6   PT/INR No results found for this basename: LABPROT, INR,  in the last 72 hours  No results found for this basename: AST, ALT, ALKPHOS, BILITOT, PROT, ALBUMIN,  in the last 168 hours   Lipase  No results found for this basename: lipase     Studies/Results: No results found.  Medications: . heparin subcutaneous  5,000 Units Subcutaneous Q8H  . 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  - MARGINS NOT INVOLVED.  - TWENTY BENIGN LYMPH NODES (0/20).  - BENIGN APPENDIX WITH FIBROUS OBLITERATION OF THE LUMEN.  Hx of Migraines (none for a few months, He has Imitrex at home for this)  Hx of restless leg syndrome  Hx of nephrolithiasis  Body mass index is 28.05  Anemia   Plan:  Regular  diet and home today.  Follow up with Dr. Carolynne Edouard in 2 weeks.   LOS: 8 days    Jeremiah Sanford 01/09/2013

## 2013-01-16 ENCOUNTER — Encounter (INDEPENDENT_AMBULATORY_CARE_PROVIDER_SITE_OTHER): Payer: Self-pay

## 2013-01-21 ENCOUNTER — Ambulatory Visit (INDEPENDENT_AMBULATORY_CARE_PROVIDER_SITE_OTHER): Payer: BC Managed Care – PPO | Admitting: General Surgery

## 2013-01-21 ENCOUNTER — Telehealth: Payer: Self-pay | Admitting: Oncology

## 2013-01-21 ENCOUNTER — Encounter (INDEPENDENT_AMBULATORY_CARE_PROVIDER_SITE_OTHER): Payer: Self-pay

## 2013-01-21 ENCOUNTER — Encounter (INDEPENDENT_AMBULATORY_CARE_PROVIDER_SITE_OTHER): Payer: Self-pay | Admitting: General Surgery

## 2013-01-21 ENCOUNTER — Telehealth: Payer: Self-pay | Admitting: *Deleted

## 2013-01-21 VITALS — BP 120/70 | HR 80 | Resp 14 | Ht 66.0 in | Wt 163.4 lb

## 2013-01-21 DIAGNOSIS — C189 Malignant neoplasm of colon, unspecified: Secondary | ICD-10-CM

## 2013-01-21 NOTE — Patient Instructions (Signed)
No heavy lifting May return to desk work on wednesday

## 2013-01-21 NOTE — Telephone Encounter (Signed)
PT SCHEDULED 08/18 @ 1:30 PER [employer] WELCOME PACKET MAILED.

## 2013-01-21 NOTE — Telephone Encounter (Signed)
Spoke with patient by phone and confirmed appointment with Dr. Truett Perna on 02/04/13.  Contact names and phone numbers were provided.

## 2013-01-21 NOTE — Progress Notes (Signed)
Subjective:     Patient ID: Jeremiah Sanford, male   DOB: 1966/03/21, 47 y.o.   MRN: [id number]  HPI The patient is a 47 year old white male who is about 3 weeks status post laparoscopic-assisted right colectomy for a T3 N0 colon cancer with 20 negative nodes. His only complaint is of some soreness at the upper edge of the incision. Otherwise his appetite is good and his bowels are working regularly. 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 He does not lift anything heavy at work.  Review of Systems     Objective:   Physical Exam On exam his abdomen is soft and nontender. His midline incision is healing nicely with no sign of infection.    Assessment:     The patient is 3 weeks status post right colectomy for colon cancer     Plan:     At this point we will refer him to medical oncology to talk about adjuvant therapy. I will plan to see him back in about one month

## 2013-01-22 ENCOUNTER — Telehealth: Payer: Self-pay | Admitting: Oncology

## 2013-01-22 NOTE — Telephone Encounter (Signed)
C/D 01/22/13 for appt. 02/04/13

## 2013-01-23 ENCOUNTER — Telehealth (INDEPENDENT_AMBULATORY_CARE_PROVIDER_SITE_OTHER): Payer: Self-pay

## 2013-01-23 ENCOUNTER — Encounter (INDEPENDENT_AMBULATORY_CARE_PROVIDER_SITE_OTHER): Payer: Self-pay

## 2013-01-23 NOTE — Telephone Encounter (Signed)
The pt called and states he has a return to work note that states he can't lift heavy things.  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  I will type a new letter and fax it to [phone].

## 2013-02-04 ENCOUNTER — Encounter: Payer: Self-pay | Admitting: Oncology

## 2013-02-04 ENCOUNTER — Telehealth: Payer: Self-pay | Admitting: *Deleted

## 2013-02-04 ENCOUNTER — Ambulatory Visit (HOSPITAL_BASED_OUTPATIENT_CLINIC_OR_DEPARTMENT_OTHER): Payer: BC Managed Care – PPO | Admitting: Oncology

## 2013-02-04 ENCOUNTER — Ambulatory Visit (HOSPITAL_BASED_OUTPATIENT_CLINIC_OR_DEPARTMENT_OTHER): Payer: BC Managed Care – PPO

## 2013-02-04 VITALS — BP 134/85 | HR 89 | Temp 97.7°F | Resp 20 | Ht 66.0 in | Wt 170.3 lb

## 2013-02-04 DIAGNOSIS — D509 Iron deficiency anemia, unspecified: Secondary | ICD-10-CM

## 2013-02-04 DIAGNOSIS — C189 Malignant neoplasm of colon, unspecified: Secondary | ICD-10-CM

## 2013-02-04 DIAGNOSIS — Z800 Family history of malignant neoplasm of digestive organs: Secondary | ICD-10-CM

## 2013-02-04 NOTE — Progress Notes (Signed)
[facility] New Patient Consult   Referring MD: Adem Costlow 47 y.o.  1966/04/17    Reason for Referral: Colon cancer     HPI: He reports a one-year history of abdominal pain. For 3-4 months he noted a history of vomiting space 3-4 weeks apart. He saw his primary physician and was noted to be anemic. A CT on 12/25/2012 revealed an irregular mass at the a sending colon. Mild stranding in the adjacent mesenteric fat with mildly prominent adjacent mesenteric lymph nodes. No focal liver lesion. No pathologically enlarged lymph nodes.  He was referred to Dr. Bosie Clos and underwent a colonoscopy on 01/01/2013. A mass was noted with complete obstruction of the a sending colon. Biopsies were taken and the area was tattooed. The biopsy revealed invasive adenocarcinoma.  He was admitted and the surgical service was consulted. He was taken the operating room by Dr. Carolynne Edouard on 01/02/2013. A laparoscopic right colectomy was performed. The tumor was noted near the hepatic flexure. No other abnormalities were noted on inspection of the abdomen.  The pathology ([id number]) confirmed a moderately differentiated adenocarcinoma of the right colon. Tumor invaded into the pericolonic connective tissue. The resection margins were negative. There were 20 benign lymph nodes. No macroscopic tumor perforation. No lymphovascular or perineural invasion was identified. No tumor deposits. The tumor returned microsatellite instability-high. Loss of PMS 2 and MLH-1 expression was identified.  The abdominal pain has resolved following surgery. He is referred to consider adjuvant treatment options.  Past Medical History  Diagnosis Date  . Mass of colon-right, invasive adenocarcinoma   01/01/2013   . Hx of migraine headaches   . Restless leg syndrome     On Medication for this  . History of nephrolithiasis   . Anemia-2014    . Cancer, right colon, stage II (T3 N0)   01/02/2013     Past  Surgical History  Procedure Laterality Date  . Laparoscopic partial colectomy N/A 01/02/2013    Procedure: LAPAROSCOPIC PROXIMAL COLECTOMY;  Surgeon: [name], MD;  Location: [facility] OR;  Service: General;  Laterality: N/A;  . Partial colectomy Right 01/02/2013    Procedure: PARTIAL COLECTOMY;  Surgeon: [name], MD;  Location: [facility] OR;  Service: General;  Laterality: Right;   Family history:  A maternal great aunt and a maternal great uncle had colon cancer in their 14s or 71s. His paternal grandmother had "eye cancer ". No other family history of cancer.   Current outpatient prescriptions:ibuprofen (ADVIL,MOTRIN) 200 MG tablet, You can take 2-3 tablets every 6 hours as needed for pain., Disp: 30 tablet, Rfl: 0;  SUMAtriptan (IMITREX) 100 MG tablet, Restless legs, Disp: , Rfl:   Allergies:  Allergies  Allergen Reactions  . Bee Venom Anaphylaxis  . Fioricet [Butalbital-Apap-Caffeine] Other (See Comments)    dizziness    Social History: He lives in [place], he works in an office occupation. He does not use cigarettes. Rare alcohol use. No transfusion history. No risk factor for HIV or hepatitis.  ROS:   Positives include: Abdominal pain, intermittent nausea and vomiting for 3-4 months prior to surgery, decreased up close visual acuity  A complete ROS was otherwise negative.  Physical Exam:  Blood pressure 134/85, pulse 89, temperature 97.7 F (36.5 C), temperature source Oral, resp. rate 20, height 5\' 6"  (1.676 m), weight 170 lb 4.8 oz (77.248 kg).  HEENT: Oropharynx without visible mass, neck without Lungs: Clear bilaterally Cardiac: Regular rate  and rhythm Abdomen: No hepatomegaly, healed surgical incisions, no mass GU: Testes without mass  Vascular: No leg edema Lymph nodes: No cervical, supraclavicular, or inguinal nodes.? "Shotty "high bilateral axillary nodes Neurologic: Alert and oriented, the motor exam appears intact in the upper and lower  extremities Skin: No rash Musculoskeletal: No spine tenderness   LAB:  CBC  Lab Results  Component Value Date   WBC 4.7 01/07/2013   HGB 8.9* 01/07/2013   HCT 29.6* 01/07/2013   MCV 70.0* 01/07/2013   PLT 251 01/07/2013     CMP      Component Value Date/Time   NA 140 01/07/2013 0600   K 4.1 01/07/2013 0600   CL 104 01/07/2013 0600   CO2 27 01/07/2013 0600   GLUCOSE 103* 01/07/2013 0600   BUN 12 01/07/2013 0600   CREATININE 0.85 01/07/2013 0600   CALCIUM 9.6 01/07/2013 0600   PROT 6.9 01/01/2013 1834   ALBUMIN 3.4* 01/01/2013 1834   AST 15 01/01/2013 1834   ALT 14 01/01/2013 1834   ALKPHOS 86 01/01/2013 1834   BILITOT 0.5 01/01/2013 1834   GFRNONAA >90 01/07/2013 0600   GFRAA >90 01/07/2013 0600   01/01/2013-CEA less than 0.5   Radiology: Chest x-ray 01/01/2013-no acute cardiopulmonary disease    Assessment/Plan:   1. Stage II (T3 N0) adenocarcinoma of the right colon, status post a right colectomy on 01/02/2013, the tumor returned microsatellite instability-high with loss of expression of PMS-2 and MLH-1.  2. Microcytic anemia   Disposition:   He has been diagnosed with stage II colon cancer. I discussed the prognosis and adjuvant treatment options with Mr. Schlosser and his wife. We reviewed the details of the surgical pathology report. He has a good prognosis for a long-term disease-free survival. I estimate his expected disease-free survival to be in the 80-85% range based on review of the adjuvant online data set.  We reviewed the controversy surrounding the use of adjuvant chemotherapy in patients with resected stage II colon cancer. His tumor does not have high-risk features such as lymphovascular/perineural invasion, bowel perforation, limited lymph node sampling, or a markedly elevated CEA.  We discussed the potential benefit of adjuvant capecitabine. We reviewed the potential toxicities associated with capecitabine including the chance for mucositis, diarrhea, hematologic  toxicity, rash, hyperpigmentation, and the hand/foot syndrome. He was given reading materials on capecitabine.  He understands the predicted absolute benefit with capecitabine in his case is small ( approximately 2-3 patients out of 100 could be cured with adjuvant capecitabine).  He will consider observation versus capecitabine. He will return for an office visit next week.  The microsatellite instability/loss of mismatch repair protein expression is likely related to  hypermethylation. I have a low clinical suspicion for hereditary non-polyposis colon cancer syndrome.  We discussed diet and exercise maneuvers to help decrease the chance of recurrent colon cancer or developing a new cancer. He should have a surveillance colonoscopy in one year. His immediate family members should undergo colorectal cancer screening regardless of the outcome of the hereditary colon cancer testing.  We will recommend he began iron replacement.  Irja Wheless 02/04/2013, 5:18 PM

## 2013-02-04 NOTE — Progress Notes (Signed)
Checked in new pt with no financial concerns. °

## 2013-02-04 NOTE — Progress Notes (Signed)
Met with patient and family. Explained role of nurse navigator. Educational information provided on colon cancer along with [facility] resource sheet and phone numbers.  Patient declined any referrals at this time and denied barriers to care.  Contact names and phone numbers for [facility] were provided.  Will continue to follow as needed.

## 2013-02-04 NOTE — Telephone Encounter (Signed)
appts made and printed...td 

## 2013-02-05 ENCOUNTER — Telehealth: Payer: Self-pay | Admitting: *Deleted

## 2013-02-05 ENCOUNTER — Other Ambulatory Visit (HOSPITAL_COMMUNITY)
Admission: RE | Admit: 2013-02-05 | Discharge: 2013-02-05 | Disposition: A | Discharge status: Home / Self Care | Payer: BC Managed Care – PPO | Source: Ambulatory Visit | Attending: Oncology | Admitting: Oncology

## 2013-02-05 DIAGNOSIS — C189 Malignant neoplasm of colon, unspecified: Secondary | ICD-10-CM | POA: Diagnosis present

## 2013-02-05 MED ORDER — FERROUS SULFATE 325 (65 FE) MG PO TBEC: 325.0000 mg | DELAYED_RELEASE_TABLET | Freq: Two times a day (BID) | ORAL | Status: DC

## 2013-02-05 NOTE — Telephone Encounter (Signed)
Left voicemail requesting pt call office. Per Dr. Truett Perna: appears to have iron deficiency anemia. Start Ferrous Sulfate 325 mg BID. (Rx sent to pharmacy) Per MD, pt to be added to GI conference 8/27.

## 2013-02-06 ENCOUNTER — Telehealth: Payer: Self-pay | Admitting: *Deleted

## 2013-02-06 NOTE — Telephone Encounter (Signed)
Left message on voicemail for pt to expect call from schedulers for one month office visit.

## 2013-02-06 NOTE — Telephone Encounter (Addendum)
Pt returned call, instructions given to begin ferrous sulfate BID. He voiced understanding. Pt states he has decided against chemotherapy. Asks if he still needs to follow up next week? Per Dr. Truett Perna: move appt out one month. Will discuss genetics results and plan any further follow up then.

## 2013-02-08 ENCOUNTER — Telehealth: Payer: Self-pay | Admitting: Oncology

## 2013-02-08 NOTE — Telephone Encounter (Signed)
pt aware of appt on 9/22 r/s from 8/27 per MD

## 2013-02-13 ENCOUNTER — Ambulatory Visit: Payer: BC Managed Care – PPO | Admitting: Nurse Practitioner

## 2013-02-21 ENCOUNTER — Encounter (INDEPENDENT_AMBULATORY_CARE_PROVIDER_SITE_OTHER): Payer: Self-pay | Admitting: General Surgery

## 2013-02-21 ENCOUNTER — Ambulatory Visit (INDEPENDENT_AMBULATORY_CARE_PROVIDER_SITE_OTHER): Payer: BC Managed Care – PPO | Admitting: General Surgery

## 2013-02-21 VITALS — BP 122/70 | HR 70 | Resp 18 | Ht 66.0 in | Wt 172.0 lb

## 2013-02-21 DIAGNOSIS — C189 Malignant neoplasm of colon, unspecified: Secondary | ICD-10-CM

## 2013-02-21 NOTE — Patient Instructions (Signed)
May increase activity level

## 2013-02-21 NOTE — Progress Notes (Signed)
Subjective:     Patient ID: Jeremiah Sanford, male   DOB: 02-02-1966, 47 y.o.   MRN: [id number]  HPI The patient is a 47 year old white male who is 7 weeks status post laparoscopic-assisted right colectomy for a T3 N0 right colon cancer. His only complaint is of some soreness at the midportion of his incision. Otherwise his appetite has been good and his bowels are working normally. He is having a little bit of constipation secondary to the iron pills. He has met with the medical oncologist and decided not to do chemotherapy  Review of Systems     Objective:   Physical Exam On exam his abdomen is soft and nontender. His midline incision is healing nicely with no sign of infection. There is no palpable groin or supraclavicular lymphadenopathy.    Assessment:     The patient is 7 weeks status post laparoscopic-assisted right colectomy for colon cancer     Plan:     At this point he will continue to followup with the medical oncologist. We will plan to see him back in about 3 months

## 2013-02-22 ENCOUNTER — Telehealth: Payer: Self-pay | Admitting: Oncology

## 2013-02-22 NOTE — Telephone Encounter (Signed)
Pt called and r/s ML appt to 9/23 from 9/22

## 2013-03-11 ENCOUNTER — Ambulatory Visit: Payer: BC Managed Care – PPO | Admitting: Nurse Practitioner

## 2013-03-12 ENCOUNTER — Telehealth: Payer: Self-pay | Admitting: *Deleted

## 2013-03-12 ENCOUNTER — Ambulatory Visit: Payer: BC Managed Care – PPO | Admitting: Nurse Practitioner

## 2013-03-12 NOTE — Telephone Encounter (Signed)
Called pt, made him aware appt will need to be rescheduled. He voiced understanding. Requested appt after 3:30 if possible.

## 2013-03-19 ENCOUNTER — Telehealth: Payer: Self-pay | Admitting: Oncology

## 2013-03-19 ENCOUNTER — Ambulatory Visit (HOSPITAL_BASED_OUTPATIENT_CLINIC_OR_DEPARTMENT_OTHER): Payer: BC Managed Care – PPO | Admitting: Oncology

## 2013-03-19 VITALS — BP 145/94 | HR 84 | Temp 98.6°F | Resp 20 | Ht 66.0 in | Wt 172.2 lb

## 2013-03-19 DIAGNOSIS — D509 Iron deficiency anemia, unspecified: Secondary | ICD-10-CM

## 2013-03-19 DIAGNOSIS — C189 Malignant neoplasm of colon, unspecified: Secondary | ICD-10-CM

## 2013-03-19 NOTE — Telephone Encounter (Signed)
Gave pt appt for lab and MD on March 2015, genetic testing on December , 1st appt that is available

## 2013-03-19 NOTE — Progress Notes (Signed)
   [facility]    OFFICE PROGRESS NOTE   INTERVAL HISTORY:   He returns for discussion of the testing for hereditary nonpalpable is colon cancer. He feels well. No complaint. He decided against adjuvant chemotherapy.  Objective: Physical exam-not performed today  Lab Results:  Lab Results  Component Value Date   WBC 4.7 01/07/2013   HGB 8.9* 01/07/2013   HCT 29.6* 01/07/2013   MCV 70.0* 01/07/2013   PLT 251 01/07/2013      Medications: I have reviewed the patient's current medications.  Assessment/Plan: 1. Stage II (T3 N0) adenocarcinoma of the right colon, status post a right colectomy on 01/02/2013, the tumor returned microsatellite instability-high with loss of expression of PMS-2 and MLH-1. MLH-1 hypermethylation testing returned negative. 2. Microcytic anemia-taking iron    Disposition:  Mr. Brendlinger decided against adjuvant chemotherapy for the stage II colon cancer.  I reviewed the results of the hypermethylation testing with him. He understands the pattern of mismatch repair protein loss in his tumor is usually associated with sporadic colon tumors. However, the negative hypermethylation test means it is possible he has hereditary non-polyposis colon cancer.  We will refer him to the genetics counselor to recommend additional testing.  A CBC will be obtained on the day of the genetics appointment.  Mr. Dredge will return for an office visit and CEA in 5 months.   Thornton Papas, MD  03/19/2013  5:07 PM

## 2013-05-27 ENCOUNTER — Telehealth: Payer: Self-pay | Admitting: Oncology

## 2013-05-27 NOTE — Telephone Encounter (Signed)
Gave pt appt for lab and genetics consult, pt moved appt to march 2015 after MD visit

## 2013-06-03 ENCOUNTER — Other Ambulatory Visit: Payer: BC Managed Care – PPO | Admitting: Lab

## 2013-06-03 ENCOUNTER — Encounter: Payer: BC Managed Care – PPO | Admitting: Genetic Counselor

## 2013-06-04 ENCOUNTER — Encounter: Payer: BC Managed Care – PPO | Admitting: Genetic Counselor

## 2013-06-04 ENCOUNTER — Ambulatory Visit (INDEPENDENT_AMBULATORY_CARE_PROVIDER_SITE_OTHER): Payer: BC Managed Care – PPO | Admitting: General Surgery

## 2013-06-04 ENCOUNTER — Other Ambulatory Visit: Payer: BC Managed Care – PPO | Admitting: Lab

## 2013-06-11 ENCOUNTER — Ambulatory Visit (INDEPENDENT_AMBULATORY_CARE_PROVIDER_SITE_OTHER): Payer: BC Managed Care – PPO | Admitting: General Surgery

## 2013-08-19 ENCOUNTER — Other Ambulatory Visit (HOSPITAL_BASED_OUTPATIENT_CLINIC_OR_DEPARTMENT_OTHER): Payer: BC Managed Care – PPO

## 2013-08-19 ENCOUNTER — Telehealth: Payer: Self-pay | Admitting: Oncology

## 2013-08-19 ENCOUNTER — Ambulatory Visit (HOSPITAL_BASED_OUTPATIENT_CLINIC_OR_DEPARTMENT_OTHER): Payer: BC Managed Care – PPO | Admitting: Oncology

## 2013-08-19 VITALS — BP 113/82 | HR 96 | Temp 97.9°F | Resp 18 | Ht 66.0 in | Wt 175.8 lb

## 2013-08-19 DIAGNOSIS — D509 Iron deficiency anemia, unspecified: Secondary | ICD-10-CM

## 2013-08-19 DIAGNOSIS — C182 Malignant neoplasm of ascending colon: Secondary | ICD-10-CM

## 2013-08-19 DIAGNOSIS — C189 Malignant neoplasm of colon, unspecified: Secondary | ICD-10-CM

## 2013-08-19 LAB — CBC WITH DIFFERENTIAL/PLATELET
BASO%: 0.5 % (ref 0.0–2.0)
Basophils Absolute: 0 10*3/uL (ref 0.0–0.1)
EOS%: 6 % (ref 0.0–7.0)
Eosinophils Absolute: 0.3 10*3/uL (ref 0.0–0.5)
HCT: 45.6 % (ref 38.4–49.9)
HGB: 15.4 g/dL (ref 13.0–17.1)
LYMPH%: 32.6 % (ref 14.0–49.0)
MCH: 32.6 pg (ref 27.2–33.4)
MCHC: 33.9 g/dL (ref 32.0–36.0)
MCV: 96.2 fL (ref 79.3–98.0)
MONO#: 0.5 10*3/uL (ref 0.1–0.9)
MONO%: 8.1 % (ref 0.0–14.0)
NEUT#: 3 10*3/uL (ref 1.5–6.5)
NEUT%: 52.8 % (ref 39.0–75.0)
Platelets: 193 10*3/uL (ref 140–400)
RBC: 4.74 10*6/uL (ref 4.20–5.82)
RDW: 12.9 % (ref 11.0–14.6)
WBC: 5.6 10*3/uL (ref 4.0–10.3)
lymph#: 1.8 10*3/uL (ref 0.9–3.3)

## 2013-08-19 NOTE — Progress Notes (Signed)
   [facility]    OFFICE PROGRESS NOTE   INTERVAL HISTORY:   He returns for scheduled followup of colon cancer. He feels well. No complaint. No difficulty with bowel function. He reports the genetics counselor appointment was canceled in December.  Objective:  Vital signs in last 24 hours:  Blood pressure 113/82, pulse 96, temperature 97.9 F (36.6 C), temperature source Oral, resp. rate 18, height _0  (1.676 m), weight 175 lb 12.8 oz (79.742 kg), SpO2 100.00%.    HEENT: Neck without mass Lymphatics: No cervical, supraclavicular, axillary, or inguinal nodes Resp: Lungs clear bilaterally Cardio: Regular rate and rhythm GI: No hepatosplenomegaly, nontender, no mass Vascular: No leg edema   Lab Results:  CEA and CBC pending   Medications: I have reviewed the patient's current medications.  Assessment/Plan: 1. Stage II (T3 N0) adenocarcinoma of the right colon, status post a right colectomy on 01/02/2013, the tumor returned microsatellite instability-high with loss of expression of PMS-2 and MLH-1. MLH-1 hypermethylation testing returned negative.  2.  history of Microcytic anemia we will check a CBC today   Disposition:  Jeremiah Sanford remains in clinical remission from colon cancer. He reports being scheduled for a one-year surveillance colonoscopy. He is scheduled to be seen in the genetics clinic on 08/22/2013. He will need mutation testing of the MLH-1 and PMS-2 genes.  He will return for an office visit and CEA in 6 months.   Betsy Coder, MD  08/19/2013  11:21 AM

## 2013-08-19 NOTE — Telephone Encounter (Signed)
, °

## 2013-08-20 ENCOUNTER — Telehealth: Payer: Self-pay | Admitting: *Deleted

## 2013-08-20 LAB — CEA: CEA: 0.6 ng/mL (ref 0.0–5.0)

## 2013-08-20 NOTE — Telephone Encounter (Signed)
Message copied by Brien Few on Tue Aug 20, 2013  9:14 AM ------      Message from: Ladell Pier      Created: Tue Aug 20, 2013  7:31 AM       Please call patient, hb and cea are normal ------

## 2013-08-22 ENCOUNTER — Ambulatory Visit (HOSPITAL_BASED_OUTPATIENT_CLINIC_OR_DEPARTMENT_OTHER): Payer: BC Managed Care – PPO | Admitting: Genetic Counselor

## 2013-08-22 ENCOUNTER — Other Ambulatory Visit: Payer: BC Managed Care – PPO

## 2013-08-22 ENCOUNTER — Encounter: Payer: Self-pay | Admitting: Genetic Counselor

## 2013-08-22 DIAGNOSIS — C182 Malignant neoplasm of ascending colon: Secondary | ICD-10-CM

## 2013-08-22 DIAGNOSIS — IMO0002 Reserved for concepts with insufficient information to code with codable children: Secondary | ICD-10-CM

## 2013-08-22 DIAGNOSIS — C189 Malignant neoplasm of colon, unspecified: Secondary | ICD-10-CM

## 2013-08-22 DIAGNOSIS — Z800 Family history of malignant neoplasm of digestive organs: Secondary | ICD-10-CM

## 2013-08-22 NOTE — Telephone Encounter (Signed)
Pt returned call, lab results given. He voiced understanding.

## 2013-08-22 NOTE — Progress Notes (Signed)
Dr.  Benay Spice, Izola Price, MD requested a consultation for genetic counseling and risk assessment for Jeremiah Sanford, a 48 y.o. male, for discussion of his personal history of colon cancer and family history of colon cancer.  He presents to clinic today to discuss the possibility of a genetic predisposition to cancer, and to further clarify his risks, as well as his family members' risks for cancer.   HISTORY OF PRESENT ILLNESS: In 2014, at the age of 25, DERIUS GHOSH was diagnosed with colon cancer of the right colon. This was treated with surgery.  Tumor testing resulted in MSI-H, and IHC loss of MLH1/PMS2.  There was no hypermethylation of MLH1, BRAF was not performed. Colonoscopy found one polyp, in addition to the cancer.   Past Medical History  Diagnosis Date  . Mass of colon 01/09/2013  . Hx of migraine headaches 01/09/2013  . Restless leg syndrome 01/09/2013    On Medication for this  . History of nephrolithiasis 01/09/2013  . Anemia   . Cancer   . Colon cancer     Past Surgical History  Procedure Laterality Date  . Laparoscopic partial colectomy N/A 01/02/2013    Procedure: LAPAROSCOPIC PROXIMAL COLECTOMY;  Surgeon: Merrie Roof, MD;  Location: [place] [facility];  Service: General;  Laterality: N/A;  . Partial colectomy Right 01/02/2013    Procedure: PARTIAL COLECTOMY;  Surgeon: Merrie Roof, MD;  Location: [facility];  Service: General;  Laterality: Right;    History   Social History  . Marital Status: Married    Spouse Name: N/A    Number of Children: 2  . Years of Education: N/A   Occupational History  .     Social History Main Topics  . Smoking status: Never Smoker   . Smokeless tobacco: Never Used  . Alcohol Use: No  . Drug Use: No  . Sexual Activity: Yes   Other Topics Concern  . None   Social History Narrative  . None    FAMILY HISTORY:  We obtained a detailed, 4-generation family history.  Significant diagnoses are listed below: Family History  Problem  Relation Age of Onset  . Heart Problems Maternal Grandmother   . Lung cancer Maternal Grandfather   . Cancer Paternal Grandmother     "eye cancer" dx in her 55s  . Heart Problems Paternal Grandfather   . Colon cancer Other     maternal grandmother's brother  . Colon cancer Other     maternal grandfather's sister   Patient's ancestors are of Dutch/German/Cherokee descent. There is no reported Ashkenazi Jewish ancestry. There is no known consanguinity.  GENETIC COUNSELING ASSESSMENT: EMETT STAPEL is a 48 y.o. 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 We, therefore, discussed and recommended the following at today's visit.   DISCUSSION: We reviewed the characteristics, features and inheritance patterns of hereditary cancer syndromes. We also discussed genetic testing, including the appropriate family members to test, the process of testing, insurance coverage and turn-around-time for results. We reviewed his tumor testing which suggests Lynch syndrome, however, BRAF testing has not been performed. I explained that even if BRAF testing suggested that this was not a Lynch tumor, based on his age of onset.  We also reviewed MYH-associated polyposis, based on his age of onset and only person in his immediate family (other than great  aunt/uncle dx at later ages) with colon cancer.  We discussed inhertitance patterns for both Lynch and MYH.  We will plan on ordering MLH1/PMS2 testing, and reflexing to the Lynch/high risk colon panel.  PLAN: After considering the risks, benefits, and limitations, JAYVIN HURRELL provided informed consent to pursue genetic testing and the blood sample will be sent to [facility] for analysis of the MLH1/PMS2 genes, reflexing to the Lynch/high risk colon cancer panel. We discussed the implications of a positive,  negative and/ or variant of uncertain significance genetic test result. Results should be available within approximately 3-4 weeks' time, at which point they will be disclosed by telephone to Nevin Bloodgood, as will any additional recommendations warranted by these results. LEHI PHIFER will receive a summary of his genetic counseling visit and a copy of his results once available. This information will also be available in Epic. We encouraged [employer] to remain in contact with cancer genetics annually so that we can continuously update the family history and inform him of any changes in cancer genetics and testing that may be of benefit for his family. Alphonso Gregson Dooley's questions were answered to his satisfaction today. Our contact information was provided should additional questions or concerns arise.  The patient was seen for a total of 60 minutes, greater than 50% of which was spent face-to-face counseling.  This note will also be sent to the referring provider via the electronic medical record. The patient will be supplied with a summary of this genetic counseling discussion as well as educational information on the discussed hereditary cancer syndromes following the conclusion of their visit.   Patient was discussed with Dr. Marcy Panning.   _______________________________________________________________________ For Office Staff:  Number of people involved in session: 2 Was an Intern/ student involved with case: no

## 2013-09-10 ENCOUNTER — Telehealth: Payer: Self-pay | Admitting: Genetic Counselor

## 2013-09-10 NOTE — Telephone Encounter (Signed)
Revealed good news on MLH1 and PMS2 testing.  Lynch/MYH and APC complete testing are being done.  If these are negative, we should consider methylation studies on the tumor, as those had not been done yet, only the BRAF.

## 2013-09-10 NOTE — Telephone Encounter (Signed)
Left good news message on VM. 

## 2013-09-25 ENCOUNTER — Encounter: Payer: Self-pay | Admitting: Genetic Counselor

## 2013-09-25 DIAGNOSIS — Z800 Family history of malignant neoplasm of digestive organs: Secondary | ICD-10-CM

## 2013-09-25 DIAGNOSIS — C189 Malignant neoplasm of colon, unspecified: Secondary | ICD-10-CM

## 2013-09-25 NOTE — Progress Notes (Signed)
HPI:  [name] was previously seen in the [facility] due to a personal and family of cancer and concerns regarding a hereditary predisposition to cancer. Please refer to our prior cancer genetics clinic note for more information regarding [name]'s medical, social and family histories, and our assessment and recommendations, at the time. [name] recent genetic test results were disclosed to him, as were recommendations warranted by these results. These results and recommendations are discussed in more detail below.  GENETIC TEST RESULTS: At the time of [name]'s visit, we recommended he pursue genetic testing of the Lynch/Colorectal High Risk gene panel. This test, which included sequencing and deletion/duplication analysis of the genes listed on the test report, was performed at [facility]. [name] was called today with his genetic test results.   Genetic testing was normal and did not reveal a mutation in any of the genes associated with Lynch syndrome or a polyposis condition. A copy of the test report has been scanned into Epic for review. We discussed with 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   SCREENING RECOMMENDATIONS: Because of the increased risk for cancer in this family which we cannot yet define through germline genetic testing, we recommended [name] and family members pursue the management guidelines below that are recommended for individuals at increased risk of Lynch syndrome-associated cancers. These can be coordinated by [name] GI doctor or his primary provider.   1.  Annual colonoscopy beginning at age 45-25 or 2-5 years prior to the earliest colon cancer diagnosis.   2. While there is no clear  evidence to support screening for stomach and small bowel cancer, an upper endoscopy can be considered at 3-5 year intervals beginning at age 17-35. However, whether to have this screening is best determined by the gastroenterologist.   3.  Annual urinalysis beginning at age 75-30.  For women with Lynch syndrome, unlike the effective surveillance plan for colorectal cancer risk, there is no professional agreement regarding management for the increased risk of uterine and ovarian cancer. However, we are available to help women and their providers establish an individualized surveillance plan. It is also important for women to understand the following:   1. Women should seek medical attention if they experience abnormal vaginal bleeding.   2. Some providers may still recommend vaginal ultrasounds, uterine biopsies (for uterine cancer risk) and/or CA-125 analysis ( for ovarian cancer risk), even though these have not been shown to be effective.  3. A hysterectomy with removal of the ovaries and fallopian tubes should be considered once childbearing is completed (if planned).  Lastly, we discussed with [name] that he should remain in contact with [place] in cancer genetics on an annual basis so we can update [name] personal and family histories and inform him of advances in cancer genetics that may be of benefit for the entire family. He also knows to call us if we can be of any further assistance.   Catherine A. Fine, MS, [facility] Certified [employer] [phone]

## 2014-02-27 ENCOUNTER — Ambulatory Visit: Payer: BC Managed Care – PPO | Admitting: Oncology

## 2014-02-27 ENCOUNTER — Other Ambulatory Visit: Payer: BC Managed Care – PPO

## 2014-05-16 ENCOUNTER — Other Ambulatory Visit: Payer: Self-pay | Admitting: *Deleted

## 2014-05-16 ENCOUNTER — Telehealth: Payer: Self-pay | Admitting: Oncology

## 2014-05-16 NOTE — Telephone Encounter (Signed)
Fax to pathology department requesting BRAF testing on case [phone]. Notified by scheduler that Jeremiah Sanford wants to continue his care a [facility] instead here. Dr. Benay Spice notified of patient transitioned his care to [facility]. Called Path dept. To cancel BRAF testing.

## 2014-05-16 NOTE — Progress Notes (Signed)
Per Dr. Benay Spice : Needs follow up here-was ftka for 02/27/14. POF to scheduler. Fax sent to [facility] department requesting BRAF results-unable to locate in EPIC.

## 2014-05-16 NOTE — Telephone Encounter (Signed)
Methylation testing was negative. Mutation testing was negative.  Please ask pathology for the BRAF result, I cannot find it in [facility]

## 2014-05-16 NOTE — Telephone Encounter (Signed)
s.w. pt to sched appt....pt advised that he didi not want to sched he is now at [facility].

## 2014-05-20 ENCOUNTER — Encounter (HOSPITAL_COMMUNITY): Payer: Self-pay

## 2014-07-17 ENCOUNTER — Emergency Department (HOSPITAL_COMMUNITY)
Admission: EM | Admit: 2014-07-17 | Discharge: 2014-07-17 | Disposition: A | Discharge status: Home / Self Care | Payer: BC Managed Care – PPO | Attending: Emergency Medicine | Admitting: Emergency Medicine

## 2014-07-17 ENCOUNTER — Encounter (HOSPITAL_COMMUNITY): Payer: Self-pay | Admitting: Emergency Medicine

## 2014-07-17 ENCOUNTER — Emergency Department (HOSPITAL_COMMUNITY): Payer: BC Managed Care – PPO

## 2014-07-17 DIAGNOSIS — R7989 Other specified abnormal findings of blood chemistry: Secondary | ICD-10-CM | POA: Insufficient documentation

## 2014-07-17 DIAGNOSIS — Z8719 Personal history of other diseases of the digestive system: Secondary | ICD-10-CM | POA: Diagnosis not present

## 2014-07-17 DIAGNOSIS — G43909 Migraine, unspecified, not intractable, without status migrainosus: Secondary | ICD-10-CM | POA: Insufficient documentation

## 2014-07-17 DIAGNOSIS — Z862 Personal history of diseases of the blood and blood-forming organs and certain disorders involving the immune mechanism: Secondary | ICD-10-CM | POA: Insufficient documentation

## 2014-07-17 DIAGNOSIS — N201 Calculus of ureter: Secondary | ICD-10-CM | POA: Insufficient documentation

## 2014-07-17 DIAGNOSIS — Z79899 Other long term (current) drug therapy: Secondary | ICD-10-CM | POA: Diagnosis not present

## 2014-07-17 DIAGNOSIS — Z85038 Personal history of other malignant neoplasm of large intestine: Secondary | ICD-10-CM | POA: Insufficient documentation

## 2014-07-17 DIAGNOSIS — N189 Chronic kidney disease, unspecified: Secondary | ICD-10-CM | POA: Insufficient documentation

## 2014-07-17 DIAGNOSIS — Z87442 Personal history of urinary calculi: Secondary | ICD-10-CM | POA: Insufficient documentation

## 2014-07-17 DIAGNOSIS — R1030 Lower abdominal pain, unspecified: Secondary | ICD-10-CM | POA: Diagnosis present

## 2014-07-17 LAB — COMPREHENSIVE METABOLIC PANEL
ALT: 36 U/L (ref 0–53)
AST: 27 U/L (ref 0–37)
Albumin: 4 g/dL (ref 3.5–5.2)
Alkaline Phosphatase: 68 U/L (ref 39–117)
Anion gap: 8 (ref 5–15)
BUN: 19 mg/dL (ref 6–23)
CO2: 26 mmol/L (ref 19–32)
Calcium: 9.2 mg/dL (ref 8.4–10.5)
Chloride: 105 mmol/L (ref 96–112)
Creatinine, Ser: 1.69 mg/dL — ABNORMAL HIGH (ref 0.50–1.35)
GFR calc Af Amer: 54 mL/min — ABNORMAL LOW (ref >90)
GFR calc non Af Amer: 47 mL/min — ABNORMAL LOW (ref >90)
Glucose, Bld: 126 mg/dL — ABNORMAL HIGH (ref 70–99)
Potassium: 4.1 mmol/L (ref 3.5–5.1)
Sodium: 139 mmol/L (ref 135–145)
Total Bilirubin: 0.8 mg/dL (ref 0.3–1.2)
Total Protein: 6.9 g/dL (ref 6.0–8.3)

## 2014-07-17 LAB — URINALYSIS, ROUTINE W REFLEX MICROSCOPIC
Bilirubin Urine: NEGATIVE
Glucose, UA: NEGATIVE mg/dL
Ketones, ur: 15 mg/dL — AB
Leukocytes, UA: NEGATIVE
Nitrite: NEGATIVE
Protein, ur: NEGATIVE mg/dL
Specific Gravity, Urine: 1.029 (ref 1.005–1.030)
Urobilinogen, UA: 0.2 mg/dL (ref 0.0–1.0)
pH: 6.5 (ref 5.0–8.0)

## 2014-07-17 LAB — CBC WITH DIFFERENTIAL/PLATELET
Basophils Absolute: 0 10*3/uL (ref 0.0–0.1)
Basophils Relative: 0 % (ref 0–1)
Eosinophils Absolute: 0.1 10*3/uL (ref 0.0–0.7)
Eosinophils Relative: 1 % (ref 0–5)
HCT: 42.9 % (ref 39.0–52.0)
Hemoglobin: 15.4 g/dL (ref 13.0–17.0)
Lymphocytes Relative: 11 % — ABNORMAL LOW (ref 12–46)
Lymphs Abs: 1.3 10*3/uL (ref 0.7–4.0)
MCH: 32.8 pg (ref 26.0–34.0)
MCHC: 35.9 g/dL (ref 30.0–36.0)
MCV: 91.5 fL (ref 78.0–100.0)
Monocytes Absolute: 0.8 10*3/uL (ref 0.1–1.0)
Monocytes Relative: 7 % (ref 3–12)
Neutro Abs: 9 10*3/uL — ABNORMAL HIGH (ref 1.7–7.7)
Neutrophils Relative %: 81 % — ABNORMAL HIGH (ref 43–77)
Platelets: 180 10*3/uL (ref 150–400)
RBC: 4.69 MIL/uL (ref 4.22–5.81)
RDW: 12.5 % (ref 11.5–15.5)
WBC: 11.1 10*3/uL — ABNORMAL HIGH (ref 4.0–10.5)

## 2014-07-17 LAB — URINE MICROSCOPIC-ADD ON

## 2014-07-17 LAB — LIPASE, BLOOD: Lipase: 23 U/L (ref 11–59)

## 2014-07-17 MED ORDER — ONDANSETRON 8 MG PO TBDP: 8.0000 mg | ORAL_TABLET | Freq: Three times a day (TID) | ORAL | Status: DC | PRN

## 2014-07-17 MED ORDER — OXYCODONE-ACETAMINOPHEN 5-325 MG PO TABS
1.0000 | ORAL_TABLET | Freq: Once | ORAL | Status: AC
  Administered 2014-07-17: 1 via ORAL
  Filled 2014-07-17: qty 1

## 2014-07-17 MED ORDER — KETOROLAC TROMETHAMINE 30 MG/ML IJ SOLN
30.0000 mg | Freq: Once | INTRAMUSCULAR | Status: AC
  Administered 2014-07-17: 30 mg via INTRAVENOUS
  Filled 2014-07-17: qty 1

## 2014-07-17 MED ORDER — LACTATED RINGERS IV BOLUS (SEPSIS)
500.0000 mL | Freq: Once | INTRAVENOUS | Status: AC
  Administered 2014-07-17: 500 mL via INTRAVENOUS

## 2014-07-17 MED ORDER — HYDROCODONE-ACETAMINOPHEN 5-325 MG PO TABS: 1.0000 | ORAL_TABLET | Freq: Four times a day (QID) | ORAL | Status: DC | PRN

## 2014-07-17 MED ORDER — TAMSULOSIN HCL 0.4 MG PO CAPS: 0.4000 mg | ORAL_CAPSULE | Freq: Every day | ORAL | Status: DC

## 2014-07-17 MED ORDER — ONDANSETRON HCL 4 MG/2ML IJ SOLN
4.0000 mg | Freq: Once | INTRAMUSCULAR | Status: AC
  Administered 2014-07-17: 4 mg via INTRAVENOUS
  Filled 2014-07-17: qty 2

## 2014-07-17 MED ORDER — FENTANYL CITRATE 0.05 MG/ML IJ SOLN
50.0000 ug | Freq: Once | INTRAMUSCULAR | Status: AC
  Administered 2014-07-17: 50 ug via INTRAVENOUS
  Filled 2014-07-17: qty 2

## 2014-07-17 NOTE — ED Notes (Signed)
Pt reports hx of kidney stones. Pt reports left lower quadrant pain that started yesterday.

## 2014-07-17 NOTE — ED Notes (Signed)
Nanavati, MD at bedside. 

## 2014-07-17 NOTE — ED Provider Notes (Signed)
  Physical Exam  BP 119/62 mmHg  Pulse 86  Temp(Src) 97.6 F (36.4 C) (Oral)  Resp 13  SpO2 96%  Physical Exam  ED Course  Procedures  MDM Patient with flank pain. Previous renal stones. Some mild hydro-on left side. No infection. Creatinine has increased. Pain is controlled and patient be discharged home to follow-up with urology.      Jasper Riling. Alvino Chapel, MD 07/17/14 [phone]

## 2014-07-17 NOTE — Discharge Instructions (Signed)
Based on your symptoms, we suspect that you have a stone. Please read the instruction provided.  See Urologist in 1 week time. Return to the Er if there is fevers, worsening and unbearable pain and inability to keep any meds down.  Your kidney enzyme is slightly elevated. Either the Urologist or your primary care doctor should follow up on the results. STAY AWAY FROM IBUPROFEN.   Ureteral Colic (Kidney Stones) Ureteral colic is the result of a condition when kidney stones form inside the kidney. Once kidney stones are formed they may move into the tube that connects the kidney with the bladder (ureter). If this occurs, this condition may cause pain (colic) in the ureter.  CAUSES  Pain is caused by stone movement in the ureter and the obstruction caused by the stone. SYMPTOMS  The pain comes and goes as the ureter contracts around the stone. The pain is usually intense, sharp, and stabbing in character. The location of the pain may move as the stone moves through the ureter. When the stone is near the kidney the pain is usually located in the back and radiates to the belly (abdomen). When the stone is ready to pass into the bladder the pain is often located in the lower abdomen on the side the stone is located. At this location, the symptoms may mimic those of a urinary tract infection with urinary frequency. Once the stone is located here it often passes into the bladder and the pain disappears completely. TREATMENT   Your caregiver will provide you with medicine for pain relief.  You may require specialized follow-up X-rays.  The absence of pain does not always mean that the stone has passed. It may have just stopped moving. If the urine remains completely obstructed, it can cause loss of kidney function or even complete destruction of the involved kidney. It is your responsibility and in your interest that X-rays and follow-ups as suggested by your caregiver are completed. Relief of pain  without passage of the stone can be associated with severe damage to the kidney, including loss of kidney function on that side.  If your stone does not pass on its own, additional measures may be taken by your caregiver to ensure its removal. HOME CARE INSTRUCTIONS   Increase your fluid intake. Water is the preferred fluid since juices containing vitamin C may acidify the urine making it less likely for certain stones (uric acid stones) to pass.  Strain all urine. A strainer will be provided. Keep all particulate matter or stones for your caregiver to inspect.  Take your pain medicine as directed.  Make a follow-up appointment with your caregiver as directed.  Remember that the goal is passage of your stone. The absence of pain does not mean the stone is gone. Follow your caregiver's instructions.  Only take over-the-counter or prescription medicines for pain, discomfort, or fever as directed by your caregiver. SEEK MEDICAL CARE IF:   Pain cannot be controlled with the prescribed medicine.  You have a fever.  Pain continues for longer than your caregiver advises it should.  There is a change in the pain, and you develop chest discomfort or constant abdominal pain.  You feel faint or pass out. MAKE SURE YOU:   Understand these instructions.  Will watch your condition.  Will get help right away if you are not doing well or get worse. Document Released: 03/16/2005 Document Revised: 10/01/2012 Document Reviewed: 12/01/2010 [facility] Patient Information 2015 [place], [place]. This information is not intended  to replace advice given to you by your health care provider. Make sure you discuss any questions you have with your health care provider. ° °

## 2014-07-17 NOTE — ED Notes (Signed)
Pt attempted to Esign, it wouldnt except signature.

## 2014-07-17 NOTE — ED Notes (Signed)
NAD at this time. Pt is stable and going home with his father.

## 2014-07-17 NOTE — ED Provider Notes (Signed)
CSN: [id number]     Arrival date & time 07/17/14  0502 History   First MD Initiated Contact with Patient 07/17/14 [phone]     Chief Complaint  Patient presents with  . Abdominal Pain  . Flank Pain     (Consider location/radiation/quality/duration/timing/severity/associated sxs/prior Treatment) HPI Comments: Pt comes in with back pain. Pt has L sided back pain. The pain is sharp and severe. Pt has hx of renal stones, and had similar pain with that. He has no uti like sx and scrotal pain. Also, pt has hx of colon Ca, currently cancer free. CT abd at [place] recently, and was negative for any suspicious lesions.    Patient is a 49 y.o. male presenting with abdominal pain and flank pain. The history is provided by the patient.  Abdominal Pain Pain location:  L flank Pain quality: sharp   Pain radiates to:  Suprapubic region Pain severity:  Severe Onset quality:  Sudden Duration:  10 hours Timing:  Intermittent Progression:  Worsening Chronicity:  New Relieved by:  Nothing Ineffective treatments:  None tried Associated symptoms: dysuria and nausea   Associated symptoms: no chest pain, no cough, no hematuria and no shortness of breath   Flank Pain Associated symptoms include abdominal pain. Pertinent negatives include no chest pain and no shortness of breath.    Past Medical History  Diagnosis Date  . Mass of colon 01/09/2013  . Hx of migraine headaches 01/09/2013  . Restless leg syndrome 01/09/2013    On Medication for this  . History of nephrolithiasis 01/09/2013  . Anemia   . Cancer   . Colon cancer    Past Surgical History  Procedure Laterality Date  . Laparoscopic partial colectomy N/A 01/02/2013    Procedure: LAPAROSCOPIC PROXIMAL COLECTOMY;  Surgeon: Merrie Roof, MD;  Location: [place];  Service: General;  Laterality: N/A;  . Partial colectomy Right 01/02/2013    Procedure: PARTIAL COLECTOMY;  Surgeon: Merrie Roof, MD;  Location: [place];  Service: General;  Laterality:  Right;   Family History  Problem Relation Age of Onset  . Heart Problems Maternal Grandmother   . Lung cancer Maternal Grandfather   . Cancer Paternal Grandmother     "eye cancer" dx in her 56s  . Heart Problems Paternal Grandfather   . Colon cancer Other     maternal grandmother's brother  . Colon cancer Other     maternal grandfather's sister   History  Substance Use Topics  . Smoking status: Never Smoker   . Smokeless tobacco: Never Used  . Alcohol Use: Yes     Comment: Occasionally    Review of Systems  Constitutional: Negative for activity change and appetite change.  Respiratory: Negative for cough and shortness of breath.   Cardiovascular: Negative for chest pain.  Gastrointestinal: Positive for nausea and abdominal pain.  Genitourinary: Positive for dysuria and flank pain. Negative for hematuria.  All other systems reviewed and are negative.     Allergies  Bee venom and Fioricet  Home Medications   Prior to Admission medications   Medication Sig Start Date End Date Taking? Authorizing Provider  Ascorbic Acid (VITAMIN C) 500 MG CAPS Take 500 mg by mouth daily.   Yes Historical Provider, MD  Fexofenadine-Pseudoephedrine (ALLEGRA-D 24 HOUR PO) Take 1 tablet by mouth daily as needed.   Yes Historical Provider, MD  Fish Oil-Krill Oil (KRILL OIL PLUS PO) Take 1 capsule by mouth daily.   Yes Historical Provider, MD  Multiple Vitamin (MULTIVITAMIN) tablet Take 1 tablet by mouth daily. Chewy vitamin   Yes Historical Provider, MD  HYDROcodone-acetaminophen (NORCO/VICODIN) 5-325 MG per tablet Take 1 tablet by mouth every 6 (six) hours as needed. 07/17/14   Varney Biles, MD  ibuprofen (ADVIL,MOTRIN) 200 MG tablet You can take 2-3 tablets every 6 hours as needed for pain. 01/09/13   Earnstine Regal, PA-C  ondansetron (ZOFRAN ODT) 8 MG disintegrating tablet Take 1 tablet (8 mg total) by mouth every 8 (eight) hours as needed for nausea. 07/17/14   Varney Biles, MD   SUMAtriptan (IMITREX) 100 MG tablet Restless legs 02/01/13   Historical Provider, MD  tamsulosin (FLOMAX) 0.4 MG CAPS capsule Take 1 capsule (0.4 mg total) by mouth daily. 07/17/14   Hever Castilleja Kathrynn Humble, MD   BP 114/71 mmHg  Pulse 85  Temp(Src) 97.6 F (36.4 C) (Oral)  Resp 18  SpO2 98% Physical Exam  Constitutional: He is oriented to person, place, and time. He appears well-developed.  HENT:  Head: Normocephalic and atraumatic.  Eyes: Conjunctivae and EOM are normal. Pupils are equal, round, and reactive to light.  Neck: Normal range of motion. Neck supple.  Cardiovascular: Normal rate and regular rhythm.   Pulmonary/Chest: Effort normal and breath sounds normal.  Abdominal: Soft. Bowel sounds are normal. He exhibits no distension. There is no tenderness. There is no rebound and no guarding.  Neurological: He is alert and oriented to person, place, and time.  Skin: Skin is warm.  Nursing note and vitals reviewed.   ED Course  Procedures (including critical care time) Labs Review Labs Reviewed  CBC WITH DIFFERENTIAL/PLATELET - Abnormal; Notable for the following:    WBC 11.1 (*)    Neutrophils Relative % 81 (*)    Neutro Abs 9.0 (*)    Lymphocytes Relative 11 (*)    All other components within normal limits  COMPREHENSIVE METABOLIC PANEL - Abnormal; Notable for the following:    Glucose, Bld 126 (*)    Creatinine, Ser 1.69 (*)    GFR calc non Af Amer 47 (*)    GFR calc Af Amer 54 (*)    All other components within normal limits  URINALYSIS, ROUTINE W REFLEX MICROSCOPIC - Abnormal; Notable for the following:    Hgb urine dipstick MODERATE (*)    Ketones, ur 15 (*)    All other components within normal limits  URINE CULTURE  LIPASE, BLOOD  URINE MICROSCOPIC-ADD ON    Imaging Review US Renal  07/17/2014   CLINICAL DATA:  One day history of left flank pain  EXAM: RENAL/URINARY TRACT ULTRASOUND COMPLETE  COMPARISON:  CT abdomen and pelvis December 25, 2012  FINDINGS: Right Kidney:   Length: 11.8 cm. Echogenicity and renal echogenicity are within normal limits. No mass, perinephric fluid, or hydronephrosis visualized. No sonographically demonstrable calculus or ureterectasis.  Left Kidney:  Length: 11.4 cm. Echogenicity and renal cortical thickness are within normal limits. No mass or perinephric fluid visualized. There is slight fullness of the collecting system, particularly in the upper pole region. No sonographically demonstrable calculus or ureterectasis.  Bladder:  Appears normal for degree of bladder distention.  IMPRESSION: Slight fullness of the left renal collecting system. A discrete focus of obstruction is not seen. Study otherwise unremarkable.   Electronically Signed   By: Lowella Grip M.D.   On: 07/17/2014 08:21     EKG Interpretation None      MDM   Final diagnoses:  Ureteral calculi  Elevated serum creatinine  CKD (chronic kidney disease), unspecified stage    Pt with c/o flank pain. Sudden, colicky, severe, and typical of his previous stones. Clinically has renal stones. CT scans from [facility] reviewed, it seems like in Oct he did have renal stones on the L side. Will get [place]. I dont think CT is mandated unless his symptoms become atypical. GU exam was normal.  Also, Cr is slightly elevated. 1.2 at [facility]. Will give small LR bolus because of that, and since he got toradol here. Dr. Alvino Chapel taking over the care.  Results discussed with the patient, and he will return to the ER is sx worse, otherwise he will see his urologist and get his Cr rechecked as well.  Varney Biles, MD 07/19/14 [phone]

## 2014-07-18 LAB — URINE CULTURE
Colony Count: NO GROWTH
Culture: NO GROWTH

## 2016-04-18 ENCOUNTER — Encounter: Payer: Self-pay | Admitting: Neurology

## 2016-04-18 ENCOUNTER — Ambulatory Visit (INDEPENDENT_AMBULATORY_CARE_PROVIDER_SITE_OTHER): Payer: BC Managed Care – PPO | Admitting: Neurology

## 2016-04-18 DIAGNOSIS — G43909 Migraine, unspecified, not intractable, without status migrainosus: Secondary | ICD-10-CM | POA: Insufficient documentation

## 2016-04-18 DIAGNOSIS — G43009 Migraine without aura, not intractable, without status migrainosus: Secondary | ICD-10-CM | POA: Diagnosis not present

## 2016-04-18 MED ORDER — DICLOFENAC POTASSIUM(MIGRAINE) 50 MG PO PACK: 50.0000 mg | PACK | ORAL | 6 refills | Status: DC | PRN

## 2016-04-18 MED ORDER — ONDANSETRON 4 MG PO TBDP: 4.0000 mg | ORAL_TABLET | Freq: Three times a day (TID) | ORAL | 6 refills | Status: DC | PRN

## 2016-04-18 MED ORDER — SUMATRIPTAN SUCCINATE 6 MG/0.5ML ~~LOC~~ SOLN: 6.0000 mg | SUBCUTANEOUS | 11 refills | Status: DC | PRN

## 2016-04-18 NOTE — Progress Notes (Signed)
PATIENT: Jeremiah Sanford DOB: 12-03-1965  Chief Complaint  Patient presents with  . Migraine    He is getting 3-4 migraines per month.  He has tried and failed several triptans and hydrocodone for pain management.  He has never tried a prophylactic medication.  He often has nausea and vomiting with his migraines.  Marland Kitchen PCP    Gaynelle Arabian, MD     HISTORICAL  Jeremiah Sanford is a 50 years old right-handed male, seen in refer by his primary care doctor  Gaynelle Arabian for evaluation of chronic migraine, initial evaluation was April 18 2016.  He had a history of colon cancer which was diagnosed in 2014, had colectomy but does not require chemoradiation therapy, suffered anemia, restless leg syndrome, he has recovered, he also had obstructive sleep apnea, again to use CPAP machine since July 2017.  He reported a history of migraine headaches since 50 years old, his typical migraine headaches are right retro-orbital area solid severe pain, lasting for 1 day to 3 days, with associated nausea or vomiting,  He used to has warning signs, gradually building up of his migraine, but since 2014, his migraine has become more frequent, he also woke up with severe migraine headaches, he has migraine 3-4 times each months, over the years, he has tried triptan treatment, totally of 8 from prescription medications without helping his symptoms, they may work initially, but quickly lost its benefit, he also tried and failed different over-the-counter medications, Tylenol, ibuprofen, Aleve, Excedrin migraine, BC powders.  Phenergan as needed, sleep usually helps his headache, his headache last 1-3 days.  I reviewed MRI of the brain with without contrast from [place] in February 26, there was no acute intracranial abnormality,  REVIEW OF SYSTEMS: Full 14 system review of systems performed and notable only for headache, snoring  ALLERGIES: Allergies  Allergen Reactions  . Bee Venom Anaphylaxis  . Fioricet  [Butalbital-Apap-Caffeine] Other (See Comments)    dizziness    HOME MEDICATIONS: Current Outpatient Prescriptions  Medication Sig Dispense Refill  . Ascorbic Acid (VITAMIN C) 500 MG CAPS Take 500 mg by mouth daily.    Marland Kitchen Fexofenadine-Pseudoephedrine (ALLEGRA-D 24 HOUR PO) Take 1 tablet by mouth daily as needed.    . Fish Oil-Krill Oil (KRILL OIL PLUS PO) Take 1 capsule by mouth daily.    Marland Kitchen ibuprofen (ADVIL,MOTRIN) 200 MG tablet You can take 2-3 tablets every 6 hours as needed for pain. 30 tablet 0  . Multiple Vitamin (MULTIVITAMIN) tablet Take 1 tablet by mouth daily. Chewy vitamin     No current facility-administered medications for this visit.     PAST MEDICAL HISTORY: Past Medical History:  Diagnosis Date  . Anemia   . Cancer ([place])   . Colon cancer ([place])   . History of nephrolithiasis 01/09/2013  . Hx of migraine headaches 01/09/2013  . Mass of colon 01/09/2013  . Restless leg syndrome 01/09/2013   On Medication for this    PAST SURGICAL HISTORY: Past Surgical History:  Procedure Laterality Date  . LAPAROSCOPIC PARTIAL COLECTOMY N/A 01/02/2013   Procedure: LAPAROSCOPIC PROXIMAL COLECTOMY;  Surgeon: Merrie Roof, MD;  Location: [place];  Service: General;  Laterality: N/A;  . PARTIAL COLECTOMY Right 01/02/2013   Procedure: PARTIAL COLECTOMY;  Surgeon: Merrie Roof, MD;  Location: [place];  Service: General;  Laterality: Right;    FAMILY HISTORY: Family History  Problem Relation Age of Onset  . Migraines Mother   . Healthy  Father   . Heart Problems Maternal Grandmother   . Lung cancer Maternal Grandfather   . Cancer Paternal Grandmother     "eye cancer" dx in her 29s  . Heart Problems Paternal Grandfather   . Colon cancer Other     maternal grandmother's brother  . Colon cancer Other     maternal grandfather's sister    SOCIAL HISTORY:  Social History   Social History  . Marital status: Married    Spouse name: N/A  . Number of children: 2  . 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 Smoking status: Never Smoker  . Smokeless tobacco: Never Used  . Alcohol use Yes     Comment: Occasionally  . Drug use: No  . Sexual activity: Yes   Other Topics Concern  . Not on file   Social History Narrative   [name]   No caffeine use.   Right-handed.     PHYSICAL EXAM   Vitals:   04/18/16 0733  BP: 132/88  Pulse: 75  Weight: 180 lb 8 oz (81.9 kg)  Height: 5\' 6"  (1.676 m)    Not recorded      Body mass index is 29.13 kg/m.  PHYSICAL EXAMNIATION:  Gen: NAD, conversant, well nourised, obese, well groomed                     Cardiovascular: Regular rate rhythm, no peripheral edema, warm, nontender. Eyes: Conjunctivae clear without exudates or hemorrhage Neck: Supple, no carotid bruits. Pulmonary: Clear to auscultation bilaterally   NEUROLOGICAL EXAM:  MENTAL STATUS: Speech:    Speech is normal; fluent and spontaneous with normal comprehension.  Cognition:     Orientation to time, place and person     Normal recent and remote memory     Normal Attention span and concentration     Normal Language, naming, repeating,spontaneous speech     Fund of knowledge   CRANIAL NERVES: CN II: Visual fields are full to confrontation. Fundoscopic exam is normal with sharp discs and no vascular changes. Pupils are round equal and briskly reactive to light. CN III, IV, VI: extraocular movement are normal. No ptosis. CN V: Facial sensation is intact to pinprick in all 3 divisions bilaterally. Corneal responses are intact.  CN VII: Face is symmetric with normal eye closure and smile. CN VIII: Hearing is normal to rubbing fingers CN IX, X: Palate elevates symmetrically. Phonation is normal. CN XI: Head turning and shoulder shrug are intact CN XII: Tongue is midline with normal movements and no atrophy.  MOTOR: There is no  pronator drift of out-stretched arms. Muscle bulk and tone are normal. Muscle strength is normal.  REFLEXES: Reflexes are 2+ and symmetric at the biceps, triceps, knees, and ankles. Plantar responses are flexor.  SENSORY: Intact to light touch, pinprick, positional sensation and vibratory sensation are intact in fingers and toes.  COORDINATION: Rapid alternating movements and fine finger movements are intact. There is no dysmetria on finger-to-nose and heel-knee-Jeremiah.    GAIT/STANCE: Posture is normal. Gait is steady with normal steps, base, arm swing, and turning. Heel and toe walking are normal. Tandem gait is normal.  Romberg is absent.   DIAGNOSTIC DATA (LABS, IMAGING, TESTING) - I reviewed patient records, labs, notes, testing and imaging myself where available.   ASSESSMENT AND PLAN  Jeremiah Sanford is  a 50 y.o. male   Chronic migraine I have suggested multiple different preventive medications, will proceed with magnesium oxide 400 mg, riboflavin 100 mg twice a day  Will try Imitrex 6 mg subcutaneous injection along with Zofran as abortive treatment, I also prescribed cambia as needed   Marcial Pacas, M.D. Ph.D.  [facility] [address] [place],  [address] Ph: [phone] Fax: [phone]  CC: Referring Provider

## 2016-04-18 NOTE — Patient Instructions (Signed)
Magnesium oxide 400 mg twice a day Riboflavin 100 mg twice a day as migraine prevention   

## 2016-06-28 ENCOUNTER — Encounter: Payer: Self-pay | Admitting: Neurology

## 2016-06-28 ENCOUNTER — Ambulatory Visit (INDEPENDENT_AMBULATORY_CARE_PROVIDER_SITE_OTHER): Payer: BC Managed Care – PPO | Admitting: Neurology

## 2016-06-28 VITALS — BP 138/86 | HR 78 | Ht 66.0 in | Wt 181.0 lb

## 2016-06-28 DIAGNOSIS — G43009 Migraine without aura, not intractable, without status migrainosus: Secondary | ICD-10-CM | POA: Diagnosis not present

## 2016-06-28 MED ORDER — NORTRIPTYLINE HCL 10 MG PO CAPS: 20.0000 mg | ORAL_CAPSULE | Freq: Every day | ORAL | 11 refills | Status: DC

## 2016-06-28 MED ORDER — TIZANIDINE HCL 4 MG PO TABS: 4.0000 mg | ORAL_TABLET | Freq: Four times a day (QID) | ORAL | 6 refills | Status: DC | PRN

## 2016-06-28 MED ORDER — RIZATRIPTAN BENZOATE 10 MG PO TBDP: 10.0000 mg | ORAL_TABLET | ORAL | 6 refills | Status: DC | PRN

## 2016-06-28 MED ORDER — PROMETHAZINE HCL 25 MG RE SUPP: 25.0000 mg | Freq: Four times a day (QID) | RECTAL | 6 refills | Status: DC | PRN

## 2016-06-28 NOTE — Progress Notes (Signed)
PATIENT: Jeremiah Sanford DOB: Mar 04, 1966  Chief Complaint  Patient presents with  . Migraine    He is getting 3-4 migraines per month.  He has tried and failed several triptans and hydrocodone for pain management.  He has never tried a prophylactic medication.  He often has nausea and vomiting with his migraines.  Marland Kitchen PCP    Gaynelle Arabian, MD     HISTORICAL  Jeremiah Sanford is a 51 years old right-handed male, seen in refer by his primary care doctor  Gaynelle Arabian for evaluation of chronic migraine, initial evaluation was April 18 2016.  He had a history of colon cancer which was diagnosed in 2014, had colectomy but does not require chemoradiation therapy, suffered anemia, restless leg syndrome, he has recovered, he also had obstructive sleep apnea, again to use CPAP machine since July 2017.  He reported a history of migraine headaches since 51 years old, his typical migraine headaches are right retro-orbital area solid severe pain, lasting for 1 day to 3 days, with associated nausea or vomiting,  He used to has warning signs, gradually building up of his migraine, but since 2014, his migraine has become more frequent, he also woke up with severe migraine headaches, he has migraine 3-4 times each month, over the years, he has tried triptan treatment, totally of 8 from prescription medications without helping his symptoms, they may work initially, but quickly lost its benefit, he also tried and failed different over-the-counter medications, Tylenol, ibuprofen, Aleve, Excedrin migraine, BC powders.  Phenergan as needed, sleep usually helps his headache, his headache last 1-3 days.  I reviewed MRI of the brain with without contrast from [place] in February 26, there was no acute intracranial abnormality,  UPDATE Jan 9th 2018: He had one migraine each week, Insurance would not pay for [place], could not do imitrex SQ injection, phenergan suppository, as needed has been helpful, he sleeps  for a few hours during acute migraine headaches,  REVIEW OF SYSTEMS: Full 14 system review of systems performed and notable only for headache, snoring  ALLERGIES: Allergies  Allergen Reactions  . Bee Venom Anaphylaxis  . Fioricet [Butalbital-Apap-Caffeine] Other (See Comments)    dizziness    HOME MEDICATIONS: Current Outpatient Prescriptions  Medication Sig Dispense Refill  . Ascorbic Acid (VITAMIN C) 500 MG CAPS Take 500 mg by mouth daily.    . Diclofenac Potassium (CAMBIA) 50 MG PACK Take 50 mg by mouth as needed. Brand only 12 each 6  . Fexofenadine-Pseudoephedrine (ALLEGRA-D 24 HOUR PO) Take 1 tablet by mouth daily as needed.    . Fish Oil-Krill Oil (KRILL OIL PLUS PO) Take 1 capsule by mouth daily.    Marland Kitchen ibuprofen (ADVIL,MOTRIN) 200 MG tablet You can take 2-3 tablets every 6 hours as needed for pain. 30 tablet 0  . Multiple Vitamin (MULTIVITAMIN) tablet Take 1 tablet by mouth daily. Chewy vitamin    . ondansetron (ZOFRAN-ODT) 4 MG disintegrating tablet Take 1 tablet (4 mg total) by mouth every 8 (eight) hours as needed for nausea or vomiting. 20 tablet 6  . SUMAtriptan (IMITREX) 6 MG/0.5ML SOLN injection Inject 0.5 mLs (6 mg total) into the skin every 2 (two) hours as needed for migraine or headache. May repeat in 2 hours if headache persists or recurs. 12 vial 11   No current facility-administered medications for this visit.     PAST MEDICAL HISTORY: Past Medical History:  Diagnosis Date  . Anemia   . Cancer ([place])   .  Colon cancer ([place])   . History of nephrolithiasis 01/09/2013  . Hx of migraine headaches 01/09/2013  . Mass of colon 01/09/2013  . Restless leg syndrome 01/09/2013   On Medication for this    PAST SURGICAL HISTORY: Past Surgical History:  Procedure Laterality Date  . LAPAROSCOPIC PARTIAL COLECTOMY N/A 01/02/2013   Procedure: LAPAROSCOPIC PROXIMAL COLECTOMY;  Surgeon: Merrie Roof, MD;  Location: [facility];  Service: General;  Laterality: N/A;  . PARTIAL  COLECTOMY Right 01/02/2013   Procedure: PARTIAL COLECTOMY;  Surgeon: Merrie Roof, MD;  Location: [facility];  Service: General;  Laterality: Right;    FAMILY HISTORY: Family History  Problem Relation Age of Onset  . Migraines Mother   . Healthy Father   . Heart Problems Maternal Grandmother   . Lung cancer Maternal Grandfather   . Cancer Paternal Grandmother     "eye cancer" dx in her 15s  . Heart Problems Paternal Grandfather   . Colon cancer Other     maternal grandmother's brother  . Colon cancer Other     maternal grandfather's sister    SOCIAL HISTORY:  Social History   Social History  . Marital status: Married    Spouse name: N/A  . Number of children: 2  . 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 Smoking status: Never Smoker  . Smokeless tobacco: Never Used  . Alcohol use Yes     Comment: Occasionally  . Drug use: No  . Sexual activity: Yes   Other Topics Concern  . Not on file   Social History Narrative   [name]   No caffeine use.   Right-handed.     PHYSICAL EXAM   There were no vitals filed for this visit.  Not recorded      There is no height or weight on file to calculate BMI.  PHYSICAL EXAMNIATION:  Gen: NAD, conversant, well nourised, obese, well groomed                     Cardiovascular: Regular rate rhythm, no peripheral edema, warm, nontender. Eyes: Conjunctivae clear without exudates or hemorrhage Neck: Supple, no carotid bruits. Pulmonary: Clear to auscultation bilaterally   NEUROLOGICAL EXAM:  MENTAL STATUS: Speech:    Speech is normal; fluent and spontaneous with normal comprehension.  Cognition:     Orientation to time, place and person     Normal recent and remote memory     Normal Attention span and concentration     Normal Language, naming, repeating,spontaneous speech     Fund of knowledge   CRANIAL  NERVES: CN II: Visual fields are full to confrontation. Fundoscopic exam is normal with sharp discs and no vascular changes. Pupils are round equal and briskly reactive to light. CN III, IV, VI: extraocular movement are normal. No ptosis. CN V: Facial sensation is intact to pinprick in all 3 divisions bilaterally. Corneal responses are intact.  CN VII: Face is symmetric with normal eye closure and smile. CN VIII: Hearing is normal to rubbing fingers CN IX, X: Palate elevates symmetrically. Phonation is normal. CN XI: Head turning and shoulder shrug are intact CN XII: Tongue is midline with normal movements and no atrophy.  MOTOR: There is no pronator drift of out-stretched arms. Muscle bulk and tone are normal. Muscle strength is normal.  REFLEXES: Reflexes are  2+ and symmetric at the biceps, triceps, knees, and ankles. Plantar responses are flexor.  SENSORY: Intact to light touch, pinprick, positional sensation and vibratory sensation are intact in fingers and toes.  COORDINATION: Rapid alternating movements and fine finger movements are intact. There is no dysmetria on finger-to-nose and heel-knee-shin.    GAIT/STANCE: Posture is normal. Gait is steady with normal steps, base, arm swing, and turning. Heel and toe walking are normal. Tandem gait is normal.  Romberg is absent.   DIAGNOSTIC DATA (LABS, IMAGING, TESTING) - I reviewed patient records, labs, notes, testing and imaging myself where available.   ASSESSMENT AND PLAN  Jeremiah Sanford is a 51 y.o. male   Chronic migraine Preventive medications nortriptyline titrating to 20 mg every night Maxalt 10 mg and Phenergan suppository as needed Imitrex injection for more severe headache,  Marcial Pacas, M.D. Ph.D.  [facility] [address] [place], [place] [address] Ph: [phone] Fax: [phone]  CC: Referring Provider

## 2016-06-28 NOTE — Patient Instructions (Signed)
Magnesium oxide 400 mg twice a day Riboflavin  100 mg twice a day 

## 2016-10-06 ENCOUNTER — Other Ambulatory Visit: Payer: Self-pay | Admitting: Family Medicine

## 2016-10-19 ENCOUNTER — Other Ambulatory Visit: Payer: Self-pay | Admitting: Family Medicine

## 2016-10-19 ENCOUNTER — Ambulatory Visit
Admission: RE | Admit: 2016-10-19 | Discharge: 2016-10-19 | Disposition: A | Discharge status: Home / Self Care | Payer: BC Managed Care – PPO | Source: Ambulatory Visit | Attending: Family Medicine | Admitting: Family Medicine

## 2016-10-19 DIAGNOSIS — R52 Pain, unspecified: Secondary | ICD-10-CM

## 2016-12-26 ENCOUNTER — Ambulatory Visit: Payer: BC Managed Care – PPO | Admitting: Adult Health

## 2017-02-21 ENCOUNTER — Ambulatory Visit (INDEPENDENT_AMBULATORY_CARE_PROVIDER_SITE_OTHER): Payer: BC Managed Care – PPO | Admitting: Adult Health

## 2017-02-21 ENCOUNTER — Encounter: Payer: Self-pay | Admitting: Adult Health

## 2017-02-21 ENCOUNTER — Ambulatory Visit: Payer: BC Managed Care – PPO | Admitting: Adult Health

## 2017-02-21 VITALS — BP 120/73 | HR 79 | Wt 177.4 lb

## 2017-02-21 DIAGNOSIS — G43009 Migraine without aura, not intractable, without status migrainosus: Secondary | ICD-10-CM | POA: Diagnosis not present

## 2017-02-21 MED ORDER — NORTRIPTYLINE HCL 10 MG PO CAPS: ORAL_CAPSULE | ORAL | 11 refills | Status: DC

## 2017-02-21 MED ORDER — RIZATRIPTAN BENZOATE 10 MG PO TBDP: 10.0000 mg | ORAL_TABLET | ORAL | 11 refills | Status: AC | PRN

## 2017-02-21 NOTE — Patient Instructions (Signed)
Your Plan:  Continue using maxalt for migraine Start Nortriptyline 10 mg at bedtime for 1 week then increase to 2 tablets at bedtime If your symptoms worsen or you develop new symptoms please let us know.    Thank you for coming to see [place] at [facility]. I hope we have been able to provide you high quality care today.  You may receive a patient satisfaction survey over the next few weeks. We would appreciate your feedback and comments so that we may continue to improve ourselves and the health of our patients.  Nortriptyline capsules What is this medicine? NORTRIPTYLINE (nor TRIP ti leen) is used to treat depression. This medicine may be used for other purposes; ask your health care provider or pharmacist if you have questions. COMMON BRAND NAME(S): Aventyl, Pamelor What should I tell my health care provider before I take this medicine? They need to know if you have any of these conditions: -an alcohol problem -bipolar disorder or schizophrenia -difficulty passing urine, prostate trouble -glaucoma -heart disease or recent heart attack -liver disease -over active thyroid -seizures -thoughts or plans of suicide or a previous suicide attempt or family history of suicide attempt -an unusual or allergic reaction to nortriptyline, other medicines, foods, dyes, or preservatives -pregnant or trying to get pregnant -breast-feeding How should I use this medicine? Take this medicine by mouth with a glass of water. Follow the directions on the prescription label. Take your doses at regular intervals. Do not take it more often than directed. Do not stop taking this medicine suddenly except upon the advice of your doctor. Stopping this medicine too quickly may cause serious side effects or your condition may worsen. A special MedGuide will be given to you by the pharmacist with each prescription and refill. Be sure to read this information carefully each time. Talk to your  pediatrician regarding the use of this medicine in children. Special care may be needed. Overdosage: If you think you have taken too much of this medicine contact a poison control center or emergency room at once. NOTE: This medicine is only for you. Do not share this medicine with others. What if I miss a dose? If you miss a dose, take it as soon as you can. If it is almost time for your next dose, take only that dose. Do not take double or extra doses. What may interact with this medicine? Do not take this medicine with any of the following medications: -arsenic trioxide -certain medicines medicines for irregular heart beat -cisapride -halofantrine -linezolid -MAOIs like Carbex, Eldepryl, Marplan, Nardil, and Parnate -methylene blue (injected into a vein) -other medicines for mental depression -phenothiazines like perphenazine, thioridazine and chlorpromazine -pimozide -probucol -procarbazine -sparfloxacin -St. John's Wort -ziprasidone This medicine may also interact with any of the following medications: -atropine and related drugs like hyoscyamine, scopolamine, tolterodine and others -barbiturate medicines for inducing sleep or treating seizures, such as phenobarbital -cimetidine -medicines for diabetes -medicines for seizures like carbamazepine or phenytoin -reserpine -thyroid medicine This list may not describe all possible interactions. Give your health care provider a list of all the medicines, herbs, non-prescription drugs, or dietary supplements you use. Also tell them if you smoke, drink alcohol, or use illegal drugs. Some items may interact with your medicine. What should I watch for while using this medicine? Tell your doctor if your symptoms do not get better or if they get worse. Visit your doctor or health care professional for regular checks on your progress. Because it  may take several weeks to see the full effects of this medicine, it is important to continue your  treatment as prescribed by your doctor. Patients and their families should watch out for new or worsening thoughts of suicide or depression. Also watch out for sudden changes in feelings such as feeling anxious, agitated, panicky, irritable, hostile, aggressive, impulsive, severely restless, overly excited and hyperactive, or not being able to sleep. If this happens, especially at the beginning of treatment or after a change in dose, call your health care professional. Dennis Bast may get drowsy or dizzy. Do not drive, use machinery, or do anything that needs mental alertness until you know how this medicine affects you. Do not stand or sit up quickly, especially if you are an older patient. This reduces the risk of dizzy or fainting spells. Alcohol may interfere with the effect of this medicine. Avoid alcoholic drinks. Do not treat yourself for coughs, colds, or allergies without asking your doctor or health care professional for advice. Some ingredients can increase possible side effects. Your mouth may get dry. Chewing sugarless gum or sucking hard candy, and drinking plenty of water may help. Contact your doctor if the problem does not go away or is severe. This medicine may cause dry eyes and blurred vision. If you wear contact lenses you may feel some discomfort. Lubricating drops may help. See your eye doctor if the problem does not go away or is severe. This medicine can cause constipation. Try to have a bowel movement at least every 2 to 3 days. If you do not have a bowel movement for 3 days, call your doctor or health care professional. This medicine can make you more sensitive to the sun. Keep out of the sun. If you cannot avoid being in the sun, wear protective clothing and use sunscreen. Do not use sun lamps or tanning beds/booths. What side effects may I notice from receiving this medicine? Side effects that you should report to your doctor or health care professional as soon as possible: -allergic  reactions like skin rash, itching or hives, swelling of the face, lips, or tongue -anxious -breathing problems -changes in vision -confusion -elevated mood, decreased need for sleep, racing thoughts, impulsive behavior -eye pain -fast, irregular heartbeat -feeling faint or lightheaded, falls -feeling agitated, angry, or irritable -fever with increased sweating -hallucination, loss of contact with reality -seizures -stiff muscles -suicidal thoughts or other mood changes -tingling, pain, or numbness in the feet or hands -trouble passing urine or change in the amount of urine -trouble sleeping -unusually weak or tired -vomiting -yellowing of the eyes or skin Side effects that usually do not require medical attention (report to your doctor or health care professional if they continue or are bothersome): -change in sex drive or performance -change in appetite or weight -constipation -dizziness -dry mouth -nausea -tired -tremors -upset stomach This list may not describe all possible side effects. Call your doctor for medical advice about side effects. You may report side effects to FDA at 1-800-FDA-1088. Where should I keep my medicine? Keep out of the reach of children. Store at room temperature between 15 and 30 degrees C (59 and 86 degrees F). Keep container tightly closed. Throw away any unused medicine after the expiration date. NOTE: This sheet is a summary. It may not cover all possible information. If you have questions about this medicine, talk to your doctor, pharmacist, or health care provider.  2018 Elsevier/Gold Standard (2015-11-06 12:53:08)

## 2017-02-21 NOTE — Progress Notes (Signed)
PATIENT: Jeremiah Sanford DOB: [birth year]  REASON FOR VISIT: follow up HISTORY FROM: patient  HISTORY OF PRESENT ILLNESS: HISTORY Jeremiah Sanford is a 51 years old right-handed male, seen in refer by his primary care doctor  Gaynelle Arabian for evaluation of chronic migraine, initial evaluation was April 18 2016.  He had a history of colon cancer which was diagnosed in 2014, had colectomy but does not require chemoradiation therapy, suffered anemia, restless leg syndrome, he has recovered, he also had obstructive sleep apnea, again to use CPAP machine since July 2017.  He reported a history of migraine headaches since 51 years old, his typical migraine headaches are right retro-orbital area solid severe pain, lasting for 1 day to 3 days, with associated nausea or vomiting,  He used to has warning signs, gradually building up of his migraine, but since 2014, his migraine has become more frequent, he also woke up with severe migraine headaches, he has migraine 3-4 times each month, over the years, he has tried triptan treatment, totally of 8 from prescription medications without helping his symptoms, they may work initially, but quickly lost its benefit, he also tried and failed different over-the-counter medications, Tylenol, ibuprofen, Aleve, Excedrin migraine, BC powders.  Phenergan as needed, sleep usually helps his headache, his headache last 1-3 days.  I reviewed MRI of the brain with without contrast from [place] in February 26, there was no acute intracranial abnormality,  UPDATE Jan 9th 2018: He had one migraine each week, Insurance would not pay for cambia, could not do imitrex SQ injection, phenergan suppository, as needed has been helpful, he sleeps for a few hours during acute migraine headaches,  Today 02/21/17  Jeremiah Sanford is a 51 year old male with a history of migraine headaches. He returns today for follow-up. He states that he has approximately 4-6 migraines a month.  They always occur above the right eye. He reports that he used to have photophobia and phonophobia however he no longer has this. He reports that he does have nausea and vomiting. He reports after he vomits his headache normally improves. He states that he does use Maxalt and his headache will resolve after 20 minutes. Reports that he is never used the Imitrex injections. He does not think he tried nortriptyline that was ordered at the last visit. He returns today for an evaluation.   REVIEW OF SYSTEMS: Out of a complete 14 system review of symptoms, the patient complains only of the following symptoms, and all other reviewed systems are negative.  See HPI  ALLERGIES: Allergies  Allergen Reactions  . Bee Venom Anaphylaxis  . Fioricet [Butalbital-Apap-Caffeine] Other (See Comments)    dizziness    HOME MEDICATIONS: Outpatient Medications Prior to Visit  Medication Sig Dispense Refill  . Ascorbic Acid (VITAMIN C) 500 MG CAPS Take 500 mg by mouth daily.    Marland Kitchen Fexofenadine-Pseudoephedrine (ALLEGRA-D 24 HOUR PO) Take 1 tablet by mouth daily as needed.    . Fish Oil-Krill Oil (KRILL OIL PLUS PO) Take 1 capsule by mouth daily.    Marland Kitchen ibuprofen (ADVIL,MOTRIN) 200 MG tablet You can take 2-3 tablets every 6 hours as needed for pain. 30 tablet 0  . Multiple Vitamin (MULTIVITAMIN) tablet Take 1 tablet by mouth daily. Chewy vitamin    . ondansetron (ZOFRAN-ODT) 4 MG disintegrating tablet Take 1 tablet (4 mg total) by mouth every 8 (eight) hours as needed for nausea or vomiting. 20 tablet 6  . promethazine (PHENERGAN) 25 MG suppository Place 1  suppository (25 mg total) rectally every 6 (six) hours as needed for nausea or vomiting. 30 each 6  . rizatriptan (MAXALT-MLT) 10 MG disintegrating tablet Take 1 tablet (10 mg total) by mouth as needed. May repeat in 2 hours if needed 15 tablet 6  . tiZANidine (ZANAFLEX) 4 MG tablet Take 1 tablet (4 mg total) by mouth every 6 (six) hours as needed for muscle spasms.  30 tablet 6  . nortriptyline (PAMELOR) 10 MG capsule Take 2 capsules (20 mg total) by mouth at bedtime. (Patient not taking: Reported on 02/21/2017) 60 capsule 11  . SUMAtriptan (IMITREX) 6 MG/0.5ML SOLN injection Inject 0.5 mLs (6 mg total) into the skin every 2 (two) hours as needed for migraine or headache. May repeat in 2 hours if headache persists or recurs. (Patient not taking: Reported on 02/21/2017) 12 vial 11   No facility-administered medications prior to visit.     PAST MEDICAL HISTORY: Past Medical History:  Diagnosis Date  . Anemia   . Cancer ([place])   . Colon cancer ([place])   . Headache   . History of nephrolithiasis 01/09/2013  . Hx of migraine headaches 01/09/2013  . Mass of colon 01/09/2013  . Restless leg syndrome 01/09/2013   On Medication for this    PAST SURGICAL HISTORY: Past Surgical History:  Procedure Laterality Date  . LAPAROSCOPIC PARTIAL COLECTOMY N/A 01/02/2013   Procedure: LAPAROSCOPIC PROXIMAL COLECTOMY;  Surgeon: Merrie Roof, MD;  Location: [facility];  Service: General;  Laterality: N/A;  . PARTIAL COLECTOMY Right 01/02/2013   Procedure: PARTIAL COLECTOMY;  Surgeon: Merrie Roof, MD;  Location: [place];  Service: General;  Laterality: Right;    FAMILY HISTORY: Family History  Problem Relation Age of Onset  . Migraines Mother   . Healthy Father   . Heart Problems Maternal Grandmother   . Lung cancer Maternal Grandfather   . Cancer Paternal Grandmother        "eye cancer" dx in her 33s  . Heart Problems Paternal Grandfather   . Colon cancer Other        maternal grandmother's brother  . Colon cancer Other        maternal grandfather's sister    SOCIAL HISTORY: Social History   Social History  . Marital status: Married    Spouse name: N/A  . Number of children: 2  . 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 Smoking status: Never Smoker  . Smokeless tobacco: Never  Used  . Alcohol use Yes     Comment: Occasionally  . Drug use: No  . Sexual activity: Yes   Other Topics Concern  . Not on file   Social History Narrative   [name]   No caffeine use.   Right-handed.      PHYSICAL EXAM  Vitals:   02/21/17 0755  BP: 120/73  Pulse: 79  Weight: 177 lb 6.4 oz (80.5 kg)   Body mass index is 28.63 kg/m.  Generalized: Well developed, in no acute distress   Neurological examination  Mentation: Alert oriented to time, place, history taking. Follows all commands speech and language fluent Cranial nerve II-XII: Pupils were equal round reactive to light. Extraocular movements were full, visual field were full on confrontational test. Facial sensation and strength were normal. Uvula tongue midline. Head turning and shoulder shrug  were normal and  symmetric. Motor: The motor testing reveals 5 over 5 strength of all 4 extremities. Good symmetric motor tone is noted throughout.  Sensory: Sensory testing is intact to soft touch on all 4 extremities. No evidence of extinction is noted.  Coordination: Cerebellar testing reveals good finger-nose-finger and heel-to-shin bilaterally.  Gait and station: Gait is normal. Tandem gait is normal. Romberg is negative. No drift is seen.  Reflexes: Deep tendon reflexes are symmetric and normal bilaterally.   DIAGNOSTIC DATA (LABS, IMAGING, TESTING) - I reviewed patient records, labs, notes, testing and imaging myself where available.  Lab Results  Component Value Date   WBC 11.1 (H) 07/17/2014   HGB 15.4 07/17/2014   HCT 42.9 07/17/2014   MCV 91.5 07/17/2014   PLT 180 07/17/2014      Component Value Date/Time   NA 139 07/17/2014 0511   K 4.1 07/17/2014 0511   CL 105 07/17/2014 0511   CO2 26 07/17/2014 0511   GLUCOSE 126 (H) 07/17/2014 0511   BUN 19 07/17/2014 0511   CREATININE 1.69 (H) 07/17/2014 0511   CALCIUM 9.2 07/17/2014 0511   PROT 6.9 07/17/2014 0511   ALBUMIN  4.0 07/17/2014 0511   AST 27 07/17/2014 0511   ALT 36 07/17/2014 0511   ALKPHOS 68 07/17/2014 0511   BILITOT 0.8 07/17/2014 0511   GFRNONAA 47 (L) 07/17/2014 0511   GFRAA 54 (L) 07/17/2014 0511      ASSESSMENT AND PLAN 51 y.o. year old male  has a past medical history of Anemia; Cancer ([place]); Colon cancer ([place]); Headache; History of nephrolithiasis (01/09/2013); migraine headaches (01/09/2013); Mass of colon (01/09/2013); and Restless leg syndrome (01/09/2013). here with:  1. Migraine headaches  The patient continues to have 4-6 migraines a month. I recommended that he try nortriptyline taking 10 mg at bedtime for the first week then increasing to 20 mg thereafter. He is amenable to trying this. I reviewed the potential side effects with the patient and provided him with a handout. He will continue using Maxalt for acute therapy. He is advised that if his symptoms worsen or he develops new symptoms he should let us know. He will follow-up in 6 months or sooner if needed.     Ward Givens, MSN, NP-C 02/21/2017, 8:08 AM [facility] [address] [place], [place] [address] [phone]

## 2017-02-23 ENCOUNTER — Ambulatory Visit: Payer: BC Managed Care – PPO | Admitting: Adult Health

## 2017-02-23 NOTE — Progress Notes (Signed)
I have reviewed and agreed above plan. 

## 2017-08-21 ENCOUNTER — Ambulatory Visit: Payer: BC Managed Care – PPO | Admitting: Adult Health

## 2017-08-21 ENCOUNTER — Encounter: Payer: Self-pay | Admitting: Adult Health

## 2017-08-21 VITALS — BP 124/70 | HR 80 | Ht 66.0 in | Wt 185.0 lb

## 2017-08-21 DIAGNOSIS — G43009 Migraine without aura, not intractable, without status migrainosus: Secondary | ICD-10-CM | POA: Diagnosis not present

## 2017-08-21 NOTE — Progress Notes (Signed)
PATIENT: Jeremiah Sanford DOB: 1966-01-08  REASON FOR VISIT: follow up HISTORY FROM: patient  HISTORY OF PRESENT ILLNESS: HISTORY Jeremiah Sanford a 52 years old right-handed male, seen in refer by his primary care doctor Gaynelle Arabian for evaluation of chronic migraine, initial evaluation was April 18 2016.  He had a history of colon cancer which was diagnosed in 2014, had colectomy but does not require chemoradiation therapy, suffered anemia, restless leg syndrome, he has recovered, he also had obstructive sleep apnea, again to use CPAP machine since July 2017.  He reported a history of migraine headaches since 52 years old, his typical migraine headaches are right retro-orbital area solid severe pain, lasting for 1 day to 3 days, with associated nausea or vomiting,  He used to has warning signs, gradually building up of his migraine, but since 2014, his migraine has become more frequent, he also woke up with severe migraine headaches, he has migraine 3-4 times each month, over the years, he has tried triptan treatment, totally of 8 from prescription medications without helping his symptoms, they may work initially, but quickly lost its benefit, he also tried and failed different over-the-counter medications, Tylenol, ibuprofen, Aleve, Excedrin migraine, BC powders.  Phenergan as needed, sleep usually helps his headache, his headache last 1-3 days.  I reviewed MRI of the brain with without contrast from [place] in February 26, there was no acute intracranial abnormality,  UPDATE Jan 9th 2018: He had one migraine each week, Insurance would not pay for cambia, could not do imitrex SQ injection, phenergan suppository,as needed has been helpful, he sleeps for a few hours during acute migraine headaches,    02/21/17  Jeremiah Sanford is a 52 year old male with a history of migraine headaches. He returns today for follow-up. He states that he has approximately 4-6 migraines a month.  They always occur above the right eye. He reports that he used to have photophobia and phonophobia however he no longer has this. He reports that he does have nausea and vomiting. He reports after he vomits his headache normally improves. He states that he does use Maxalt and his headache will resolve after 20 minutes. Reports that he is never used the Imitrex injections. He does not think he tried nortriptyline that was ordered at the last visit. He returns today for an evaluation.  Today 08/21/17   Jeremiah Sanford is a 52 year old male with a history of migraine headaches.  He returns today for follow-up.  He reports that his headaches have been under relatively good control into the last couple of weeks.  He states his frequency has increased but he relates this to increased stress.  He states that he did try nortriptyline but ultimately stopped the medication as he does not want to have to take anything daily.  He states taking Maxalt resolves his headaches.  He reports that his headaches always occur above the right eye.  He denies photophobia and phonophobia as well as nausea and vomiting.  He returns today for evaluation.    REVIEW OF SYSTEMS: Out of a complete 14 system review of symptoms, the patient complains only of the following symptoms, and all other reviewed systems are negative.  See HPI  ALLERGIES: Allergies  Allergen Reactions  . Bee Venom Anaphylaxis  . Fioricet [Butalbital-Apap-Caffeine] Other (See Comments)    dizziness    HOME MEDICATIONS: Outpatient Medications Prior to Visit  Medication Sig Dispense Refill  . Ascorbic Acid (VITAMIN C) 500 MG CAPS Take 500  mg by mouth daily.    Marland Kitchen aspirin 81 MG tablet Take 81 mg by mouth daily.    . Cholecalciferol (VITAMIN D PO) Take 2 capsules by mouth daily.    . Multiple Vitamin (MULTIVITAMIN) tablet Take 1 tablet by mouth daily. Chewy vitamin    . omeprazole (PRILOSEC) 20 MG capsule 20 mg daily.    . rizatriptan (MAXALT-MLT) 10 MG  disintegrating tablet Take 1 tablet (10 mg total) by mouth as needed. May repeat in 2 hours if needed 15 tablet 11  . promethazine (PHENERGAN) 25 MG suppository Place 1 suppository (25 mg total) rectally every 6 (six) hours as needed for nausea or vomiting. (Patient not taking: Reported on 08/21/2017) 30 each 6  . SUMAtriptan (IMITREX) 6 MG/0.5ML SOLN injection Inject 0.5 mLs (6 mg total) into the skin every 2 (two) hours as needed for migraine or headache. May repeat in 2 hours if headache persists or recurs. (Patient not taking: Reported on 02/21/2017) 12 vial 11  . Fexofenadine-Pseudoephedrine (ALLEGRA-D 24 HOUR PO) Take 1 tablet by mouth daily as needed.    . Fish Oil-Krill Oil (KRILL OIL PLUS PO) Take 1 capsule by mouth daily.    Marland Kitchen ibuprofen (ADVIL,MOTRIN) 200 MG tablet You can take 2-3 tablets every 6 hours as needed for pain. (Patient not taking: Reported on 08/21/2017) 30 tablet 0  . nortriptyline (PAMELOR) 10 MG capsule Take 1 tablet at bedtime for 1 week then increase to 2 tablets at bedtime (Patient not taking: Reported on 08/21/2017) 60 capsule 11  . ondansetron (ZOFRAN-ODT) 4 MG disintegrating tablet Take 1 tablet (4 mg total) by mouth every 8 (eight) hours as needed for nausea or vomiting. (Patient not taking: Reported on 08/21/2017) 20 tablet 6  . tiZANidine (ZANAFLEX) 4 MG tablet Take 1 tablet (4 mg total) by mouth every 6 (six) hours as needed for muscle spasms. (Patient not taking: Reported on 08/21/2017) 30 tablet 6   No facility-administered medications prior to visit.     PAST MEDICAL HISTORY: Past Medical History:  Diagnosis Date  . Anemia   . Cancer ([place])   . Colon cancer ([place])   . Headache   . History of nephrolithiasis 01/09/2013  . Hx of migraine headaches 01/09/2013  . Mass of colon 01/09/2013  . Restless leg syndrome 01/09/2013   On Medication for this    PAST SURGICAL HISTORY: Past Surgical History:  Procedure Laterality Date  . LAPAROSCOPIC PARTIAL COLECTOMY N/A 01/02/2013     Procedure: LAPAROSCOPIC PROXIMAL COLECTOMY;  Surgeon: Merrie Roof, MD;  Location: [facility];  Service: General;  Laterality: N/A;  . PARTIAL COLECTOMY Right 01/02/2013   Procedure: PARTIAL COLECTOMY;  Surgeon: Merrie Roof, MD;  Location: [place];  Service: General;  Laterality: Right;    FAMILY HISTORY: Family History  Problem Relation Age of Onset  . Migraines Mother   . Healthy Father   . Heart Problems Maternal Grandmother   . Lung cancer Maternal Grandfather   . Cancer Paternal Grandmother        "eye cancer" dx in her 52s  . Heart Problems Paternal Grandfather   . Colon cancer Other        maternal grandmother's brother  . Colon cancer Other        maternal grandfather's sister    SOCIAL HISTORY: Social History   Socioeconomic History  . Marital status: Married    Spouse name: Not on file  . Number of children: 2  . Years  of education: HS  . Highest education level: Not on file  Social Needs  . Financial resource strain: Not on file  . Food insecurity - worry: Not on file  . Food insecurity - inability: Not on file  . Transportation needs - medical: Not on file  . Transportation needs - non-medical: Not on file  Occupational History  . 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 Smoking status: Never Smoker  . Smokeless tobacco: Never Used  Substance and Sexual Activity  . Alcohol use: Yes    Comment: Occasionally  . Drug use: No  . Sexual activity: Yes  Other Topics Concern  . Not on file  Social History Narrative   [name]   No caffeine use.   Right-handed.      PHYSICAL EXAM  Vitals:   08/21/17 0742  BP: 124/70  Pulse: 80  Weight: 185 lb (83.9 kg)  Height: 5\' 6"  (1.676 m)   Body mass index is 29.86 kg/m.  Generalized: Well developed, in no acute distress   Neurological examination  Mentation: Alert oriented to time, place, history taking. Follows all commands speech  and language fluent Cranial nerve II-XII: Pupils were equal round reactive to light. Extraocular movements were full, visual field were full on confrontational test. Facial sensation and strength were normal. Uvula tongue midline. Head turning and shoulder shrug  were normal and symmetric. Motor: The motor testing reveals 5 over 5 strength of all 4 extremities. Good symmetric motor tone is noted throughout.  Sensory: Sensory testing is intact to soft touch on all 4 extremities. No evidence of extinction is noted.  Coordination: Cerebellar testing reveals good finger-nose-finger and heel-to-shin bilaterally.  Gait and station: Gait is normal. Tandem gait is normal. Romberg is negative. No drift is seen.  Reflexes: Deep tendon reflexes are symmetric and normal bilaterally.   DIAGNOSTIC DATA (LABS, IMAGING, TESTING) - I reviewed patient records, labs, notes, testing and imaging myself where available.  Lab Results  Component Value Date   WBC 11.1 (H) 07/17/2014   HGB 15.4 07/17/2014   HCT 42.9 07/17/2014   MCV 91.5 07/17/2014   PLT 180 07/17/2014      Component Value Date/Time   NA 139 07/17/2014 0511   K 4.1 07/17/2014 0511   CL 105 07/17/2014 0511   CO2 26 07/17/2014 0511   GLUCOSE 126 (H) 07/17/2014 0511   BUN 19 07/17/2014 0511   CREATININE 1.69 (H) 07/17/2014 0511   CALCIUM 9.2 07/17/2014 0511   PROT 6.9 07/17/2014 0511   ALBUMIN 4.0 07/17/2014 0511   AST 27 07/17/2014 0511   ALT 36 07/17/2014 0511   ALKPHOS 68 07/17/2014 0511   BILITOT 0.8 07/17/2014 0511   GFRNONAA 47 (L) 07/17/2014 0511   GFRAA 54 (L) 07/17/2014 0511      ASSESSMENT AND PLAN 52 y.o. year old male  has a past medical history of Anemia, Cancer ([place]), Colon cancer ([place]), Headache, History of nephrolithiasis (01/09/2013), migraine headaches (01/09/2013), Mass of colon (01/09/2013), and Restless leg syndrome (01/09/2013). here withL  1. Migraine headaches  Overall the patient is doing well.  He will continue  using Maxalt to treat his headaches.  The patient does not want to be on a medication daily.  I did advise that if his headache frequency increases I recommend trying a daily preventative medication.  He voiced understanding.  He will follow-up in 1 year or sooner if  needed.  I spent 15 minutes with the patient. 50% of this time was spent discussing his headaches and medication.   Ward Givens, MSN, NP-C 08/21/2017, 7:46 AM [facility] [address], [place], [place] [address] [phone]

## 2017-08-21 NOTE — Patient Instructions (Signed)
Your Plan:  Continue Maxalt  If headache frequency increase please let us know If your symptoms worsen or you develop new symptoms please let us know.   Thank you for coming to see [place] at [facility]. I hope we have been able to provide you high quality care today.  You may receive a patient satisfaction survey over the next few weeks. We would appreciate your feedback and comments so that we may continue to improve ourselves and the health of our patients.

## 2017-08-22 NOTE — Progress Notes (Signed)
I have reviewed and agreed above plan. 

## 2017-12-07 ENCOUNTER — Other Ambulatory Visit (HOSPITAL_COMMUNITY): Payer: Self-pay | Admitting: Family Medicine

## 2017-12-07 DIAGNOSIS — D72819 Decreased white blood cell count, unspecified: Secondary | ICD-10-CM

## 2017-12-07 DIAGNOSIS — R748 Abnormal levels of other serum enzymes: Secondary | ICD-10-CM

## 2017-12-07 DIAGNOSIS — D696 Thrombocytopenia, unspecified: Secondary | ICD-10-CM

## 2017-12-08 ENCOUNTER — Ambulatory Visit (HOSPITAL_COMMUNITY): Payer: BC Managed Care – PPO

## 2018-06-19 ENCOUNTER — Encounter (INDEPENDENT_AMBULATORY_CARE_PROVIDER_SITE_OTHER): Payer: Self-pay | Admitting: Orthopaedic Surgery

## 2018-06-19 ENCOUNTER — Ambulatory Visit (INDEPENDENT_AMBULATORY_CARE_PROVIDER_SITE_OTHER): Payer: Self-pay

## 2018-06-19 ENCOUNTER — Ambulatory Visit (INDEPENDENT_AMBULATORY_CARE_PROVIDER_SITE_OTHER): Payer: BC Managed Care – PPO | Admitting: Orthopaedic Surgery

## 2018-06-19 DIAGNOSIS — G8929 Other chronic pain: Secondary | ICD-10-CM

## 2018-06-19 DIAGNOSIS — M25511 Pain in right shoulder: Secondary | ICD-10-CM

## 2018-06-19 MED ORDER — METHYLPREDNISOLONE 4 MG PO TABS: ORAL_TABLET | ORAL | 0 refills | Status: DC

## 2018-06-19 MED ORDER — METHYLPREDNISOLONE ACETATE 40 MG/ML IJ SUSP
40.0000 mg | INTRAMUSCULAR | Status: AC | PRN
  Administered 2018-06-19: 40 mg via INTRAMUSCULAR

## 2018-06-19 MED ORDER — METHOCARBAMOL 500 MG PO TABS: 500.0000 mg | ORAL_TABLET | Freq: Four times a day (QID) | ORAL | 0 refills | Status: DC | PRN

## 2018-06-19 MED ORDER — LIDOCAINE HCL 1 % IJ SOLN
1.0000 mL | INTRAMUSCULAR | Status: AC | PRN
  Administered 2018-06-19: 1 mL

## 2018-06-19 MED ORDER — NABUMETONE 500 MG PO TABS: 500.0000 mg | ORAL_TABLET | Freq: Two times a day (BID) | ORAL | 0 refills | Status: DC | PRN

## 2018-06-19 NOTE — Progress Notes (Signed)
+   Office Visit Note   Patient: Jeremiah Sanford           Date of Birth: 1966/01/26           MRN: [id number] Visit Date: 06/19/2018              Requested by: Gaynelle Arabian, MD [address] [place], [place] [address] PCP: Gaynelle Arabian, MD   Assessment & Plan: Visit Diagnoses:  1. Chronic right shoulder pain   2. Trigger point of right shoulder region     Plan: His signs and symptoms and clinical exam are most consistent with a trigger point.  I found an isolated 1 of the areas of maximum tenderness and provided a steroid injection in this area next with lidocaine.  He got immediate relief from his symptoms.  I am also going to try a combination of a steroid taper, Relafen and Robaxin.  All question concerns were answered and addressed.  We will see him back in 4 weeks to see how is doing overall.  If he had a response that will be great but he may still consider physical therapy.  Follow-Up Instructions: Return in about 4 weeks (around 07/17/2018).   Orders:  Orders Placed This Encounter  Procedures  . Trigger Point Inj  . XR Shoulder Right   Meds ordered this encounter  Medications  . methylPREDNISolone (MEDROL) 4 MG tablet    Sig: Medrol dose pack. Take as instructed    Dispense:  21 tablet    Refill:  0  . methocarbamol (ROBAXIN) 500 MG tablet    Sig: Take 1 tablet (500 mg total) by mouth every 6 (six) hours as needed for muscle spasms.    Dispense:  60 tablet    Refill:  0  . nabumetone (RELAFEN) 500 MG tablet    Sig: Take 1 tablet (500 mg total) by mouth 2 (two) times daily as needed.    Dispense:  60 tablet    Refill:  0      Procedures: Trigger Point Inj Date/Time: 06/19/2018 10:14 AM Performed by: Mcarthur Rossetti, MD Authorized by: Mcarthur Rossetti, MD    Indications:  Pain Total # of Trigger Points:  1 Location: shoulder   Medications #1:  1 mL lidocaine 1 %; 40 mg methylPREDNISolone acetate 40 MG/ML     Clinical Data: No additional findings.   Subjective: Chief Complaint  Patient presents with  . Right Shoulder - Pain  Patient is a very pleasant 52 year old gentleman who comes in for a one-year history of right shoulder pain but he points the parascapular area in his thoracic spine the source of his pain.  He denies any significant shoulder pain in the shoulder proper.  He denies any numbness and tingling going down his arm and into his hand.  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  Is been worse with certain posture activities as well.  He gets some stiffness in  his neck as well.  He denies any specific injury and this is been slowly getting worse with time.  It becomes uncomfortable at different times during the day.  He has not tried any specific anti-inflammatories.  He has tried a TENS unit that helped minimally.  He is not been through any physical therapy.  HPI  Review of Systems He currently denies any headache, chest pain, shortness of breath, fever, chills, nausea, vomiting.  Objective: Vital Signs: There were no vitals taken for this visit.  Physical Exam He is alert and orient x3 and in no acute distress Ortho Exam On examination he has fluid and full right shoulder motion with no pain in the shoulder at all and no weakness in the shoulder.  His tenderness is to palpation over the medial border of the scapula and trapezius area consistent with trigger point pain.  He has excellent grip strength as well.  His range of motion of his neck is normal.  He has a negative Spurling sign. Specialty Comments:  No specialty comments available.  Imaging: Xr Shoulder Right  Result Date: 06/19/2018 3 views of the right shoulder show no acute findings.  The shoulder is well located.    PMFS  History: Patient Active Problem List   Diagnosis Date Noted  . Migraine 04/18/2016  . Family history of malignant neoplasm of gastrointestinal tract 09/25/2013  . Colon cancer ([facility]) 01/21/2013  . Mass of colon 01/09/2013  . Hx of migraine headaches 01/09/2013  . Restless leg syndrome 01/09/2013  . History of nephrolithiasis 01/09/2013   Past Medical History:  Diagnosis Date  . Anemia   . Cancer ([facility])   . Colon cancer ([facility])   . Headache   . History of nephrolithiasis 01/09/2013  . Hx of migraine headaches 01/09/2013  . Mass of colon 01/09/2013  . Restless leg syndrome 01/09/2013   On Medication for this    Family History  Problem Relation Age of Onset  . Migraines Mother   . Healthy Father   . Heart Problems Maternal Grandmother   . Lung cancer Maternal Grandfather   . Cancer Paternal Grandmother        "eye cancer" dx in her 81s  . Heart Problems Paternal Grandfather   . Colon cancer Other        maternal grandmother's brother  . Colon cancer Other        maternal grandfather's sister    Past Surgical History:  Procedure Laterality Date  . LAPAROSCOPIC PARTIAL COLECTOMY N/A 01/02/2013   Procedure: LAPAROSCOPIC PROXIMAL COLECTOMY;  Surgeon: Merrie Roof, MD;  Location: [facility];  Service: General;  Laterality: N/A;  . PARTIAL COLECTOMY Right 01/02/2013   Procedure: PARTIAL COLECTOMY;  Surgeon: Merrie Roof, MD;  Location: [facility];  Service: General;  Laterality: Right;   Social History   Occupational History  . 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 Smoking status: Never Smoker  . Smokeless tobacco: Never Used  Substance and Sexual Activity  . Alcohol use: Yes    Comment: Occasionally  . Drug use: No  . Sexual activity: Yes

## 2018-07-17 ENCOUNTER — Ambulatory Visit (INDEPENDENT_AMBULATORY_CARE_PROVIDER_SITE_OTHER): Payer: BC Managed Care – PPO | Admitting: Orthopaedic Surgery

## 2018-07-17 ENCOUNTER — Encounter (INDEPENDENT_AMBULATORY_CARE_PROVIDER_SITE_OTHER): Payer: Self-pay | Admitting: Orthopaedic Surgery

## 2018-07-17 DIAGNOSIS — M25511 Pain in right shoulder: Secondary | ICD-10-CM | POA: Diagnosis not present

## 2018-07-17 MED ORDER — LIDOCAINE HCL 1 % IJ SOLN
1.0000 mL | INTRAMUSCULAR | Status: AC | PRN
  Administered 2018-07-17: 1 mL

## 2018-07-17 MED ORDER — METHYLPREDNISOLONE ACETATE 40 MG/ML IJ SUSP
40.0000 mg | INTRAMUSCULAR | Status: AC | PRN
  Administered 2018-07-17: 40 mg via INTRAMUSCULAR

## 2018-07-17 NOTE — Progress Notes (Signed)
Office Visit Note   Patient: Jeremiah Sanford           Date of Birth: Nov 19, 1965           MRN: [id number] Visit Date: 07/17/2018              Requested by: Gaynelle Arabian, MD [address] [place],  [id number] PCP: Gaynelle Arabian, MD   Assessment & Plan: Visit Diagnoses:  1. Trigger point of right shoulder region     Plan: I did provide another steroid injection and a trigger point in his parascapular area on the right side.  I then compressed the area with my knuckle very hard.  I want him to try tennis ball in this area against a wall on a daily basis at least the next week or 2 and I showed him how to do this to press against the parascapular area.  Follow-up otherwise be as needed.  All question concerns were answered and addressed.  Our next up would be formal physical therapy if he continues to have issues in this area.  Follow-Up Instructions: Return if symptoms worsen or fail to improve.   Orders:  Orders Placed This Encounter  Procedures  . Trigger Point Inj   No orders of the defined types were placed in this encounter.     Procedures: Trigger Point Inj Date/Time: 07/17/2018 8:27 AM Performed by: Mcarthur Rossetti, MD Authorized by: Mcarthur Rossetti, MD   Indications:  Pain Total # of Trigger Points:  1 Location: shoulder   Medications #1:  1 mL lidocaine 1 %; 40 mg methylPREDNISolone acetate 40 MG/ML     Clinical Data: No additional findings.   Subjective: Chief Complaint  Patient presents with  . Right Shoulder - Follow-up   Patient comes in today with continued trigger point pain in his right parascapular area.  We did inject him at his last visit and he said he got about 70 to 80% relief.  He still not interested in any type of therapy.  He is not a diabetic.  He denies any shoulder weakness and denies any numbness and tingling in his arms he denies any neck pain.  He would like to have another injection  today.  HPI  Review of Systems He currently denies any headache, chest pain, shortness of breath, fever, chills, nausea, vomiting  Objective: Vital Signs: There were no vitals taken for this visit.  Physical Exam He is alert and orient x3 and in no acute distress Ortho Exam Examination of his right parascapular area shows trigger point pain but otherwise no deficits at all.  There is no muscle atrophy. Specialty Comments:  No specialty comments available.  Imaging: No results found.   PMFS History: Patient Active Problem List   Diagnosis Date Noted  . Migraine 04/18/2016  . Family history of malignant neoplasm of gastrointestinal tract 09/25/2013  . Colon cancer ([facility]) 01/21/2013  . Mass of colon 01/09/2013  . Hx of migraine headaches 01/09/2013  . Restless leg syndrome 01/09/2013  . History of nephrolithiasis 01/09/2013   Past Medical History:  Diagnosis Date  . Anemia   . Cancer ([facility])   . Colon cancer ([facility])   . Headache   . History of nephrolithiasis 01/09/2013  . Hx of migraine headaches 01/09/2013  . Mass of colon 01/09/2013  . Restless leg syndrome 01/09/2013   On Medication for this    Family History  Problem Relation Age of Onset  .  Migraines Mother   . Healthy Father   . Heart Problems Maternal Grandmother   . Lung cancer Maternal Grandfather   . Cancer Paternal Grandmother        "eye cancer" dx in her 64s  . Heart Problems Paternal Grandfather   . Colon cancer Other        maternal grandmother's brother  . Colon cancer Other        maternal grandfather's sister    Past Surgical History:  Procedure Laterality Date  . LAPAROSCOPIC PARTIAL COLECTOMY N/A 01/02/2013   Procedure: LAPAROSCOPIC PROXIMAL COLECTOMY;  Surgeon: Merrie Roof, MD;  Location: [facility];  Service: General;  Laterality: N/A;  . PARTIAL COLECTOMY Right 01/02/2013   Procedure: PARTIAL COLECTOMY;  Surgeon: Merrie Roof, MD;  Location: [facility];  Service: General;  Laterality: Right;    Social History   Occupational History  . 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 Smoking status: Never Smoker  . Smokeless tobacco: Never Used  Substance and Sexual Activity  . Alcohol use: Yes    Comment: Occasionally  . Drug use: No  . Sexual activity: Yes

## 2018-08-23 ENCOUNTER — Ambulatory Visit: Payer: BC Managed Care – PPO | Admitting: Adult Health

## 2018-10-16 ENCOUNTER — Telehealth: Payer: Self-pay

## 2018-10-16 NOTE — Telephone Encounter (Signed)
Spoke with the patient to convert his appt into a virtual webex visit with Butler Denmark, NP 10/22/2018. Patient declined webex and telephone visit. He stated that he would rather be seen in person and that he would call our office to r/s his appt.

## 2018-10-22 ENCOUNTER — Ambulatory Visit: Payer: BC Managed Care – PPO | Admitting: Neurology

## 2018-10-22 ENCOUNTER — Ambulatory Visit: Payer: BC Managed Care – PPO | Admitting: Adult Health

## 2019-02-27 ENCOUNTER — Ambulatory Visit: Payer: BC Managed Care – PPO | Admitting: Orthopaedic Surgery

## 2019-03-04 ENCOUNTER — Ambulatory Visit: Payer: Self-pay

## 2019-03-04 ENCOUNTER — Other Ambulatory Visit: Payer: Self-pay

## 2019-03-04 ENCOUNTER — Encounter: Payer: Self-pay | Admitting: Orthopaedic Surgery

## 2019-03-04 ENCOUNTER — Ambulatory Visit (INDEPENDENT_AMBULATORY_CARE_PROVIDER_SITE_OTHER): Payer: BC Managed Care – PPO | Admitting: Orthopaedic Surgery

## 2019-03-04 DIAGNOSIS — M25521 Pain in right elbow: Secondary | ICD-10-CM | POA: Diagnosis not present

## 2019-03-04 DIAGNOSIS — M25522 Pain in left elbow: Secondary | ICD-10-CM

## 2019-03-04 DIAGNOSIS — M7712 Lateral epicondylitis, left elbow: Secondary | ICD-10-CM | POA: Diagnosis not present

## 2019-03-04 DIAGNOSIS — M7711 Lateral epicondylitis, right elbow: Secondary | ICD-10-CM

## 2019-03-04 MED ORDER — LIDOCAINE HCL 1 % IJ SOLN
1.0000 mL | INTRAMUSCULAR | Status: AC | PRN
  Administered 2019-03-04: 1 mL

## 2019-03-04 MED ORDER — METHYLPREDNISOLONE 4 MG PO TABS: ORAL_TABLET | ORAL | 0 refills | Status: DC

## 2019-03-04 MED ORDER — METHYLPREDNISOLONE ACETATE 40 MG/ML IJ SUSP
40.0000 mg | INTRAMUSCULAR | Status: AC | PRN
  Administered 2019-03-04: 40 mg

## 2019-03-04 NOTE — Progress Notes (Signed)
Office Visit Note   Patient: Jeremiah Sanford           Date of Birth: 1965/07/15           MRN: [id number] Visit Date: 03/04/2019              Requested by: Gaynelle Arabian, MD [address] [place],   [address] PCP: Gaynelle Arabian, MD   Assessment & Plan: Visit Diagnoses:  1. Pain in left elbow   2. Pain in right elbow   3. Lateral epicondylitis, left elbow   4. Lateral epicondylitis, right elbow     Plan: I do feel that his differential diagnosis includes lateral condyle lightest of both his elbows.  I explained the diagnosis.  I showed him stretching exercises to try.  He is going to try to avoid repetitive activities as well.  I did offer an injection over the epicondyle area of both elbows and he agreed to this and tolerated it well.  We will try Voltaren gel and a steroid Dosepak.  All question concerns were answered addressed.  Follow-up is as needed.  If this does not improve my neck step would be recommending physical therapy  Follow-Up Instructions: Return if symptoms worsen or fail to improve.   Orders:  Orders Placed This Encounter  Procedures  . Hand/UE Inj  . Hand/UE Inj  . XR Elbow Complete Left (3+View)  . XR Elbow Complete Right (3+View)   No orders of the defined types were placed in this encounter.     Procedures: Hand/UE Inj: R elbow for lateral epicondylitis on 03/04/2019 3:02 PM Medications: 1 mL lidocaine 1 %; 40 mg methylPREDNISolone acetate 40 MG/ML  Hand/UE Inj: L elbow for lateral epicondylitis on 03/04/2019 3:02 PM Medications: 1 mL lidocaine 1 %; 40 mg methylPREDNISolone acetate 40 MG/ML      Clinical Data: No additional findings.   Subjective: Chief Complaint  Patient presents with  . Right Elbow - Pain  . Left Elbow - Pain  The patient comes in with a chief complaint of bilateral elbow pain.  He points the lateral epicondyle area of both elbows a source of his pain.  His right is much worse than his left.  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  He denies any numbness tingling in his hands.  He denies any numbness tingling at the elbows.  He denies any decrease in motion with his elbows.  This is been slowly getting worse over the last few months.  HPI  Review of Systems He currently denies any headache, chest pain, shortness of breath, fever, chills, nausea, vomiting  Objective: Vital Signs: There were no vitals taken for this visit.  Physical Exam He is alert and orient x3 and in no acute distress Ortho Exam Both elbows are examined.  Both elbows have full range of motion.  Both elbows are located and are ligamentously stable.  Both elbows have severe pain over the lateral epicondyle area of both elbows. Specialty Comments:  No specialty comments available.  Imaging: Xr Elbow Complete Left (3+view)  Result Date: 03/04/2019 3 views of the left elbow  Xr Elbow Complete Right (3+view)  Result Date: 03/04/2019 3 views of the right elbow show no acute findings.  The elbow is well located.    PMFS History: Patient Active Problem List   Diagnosis Date Noted  . Migraine 04/18/2016  .  Family history of malignant neoplasm of gastrointestinal tract 09/25/2013  . Colon cancer ([facility]) 01/21/2013  . Mass of colon 01/09/2013  . Hx of migraine headaches 01/09/2013  . Restless leg syndrome 01/09/2013  . History of nephrolithiasis 01/09/2013   Past Medical History:  Diagnosis Date  . Anemia   . Cancer ([facility])   . Colon cancer ([facility])   . Headache   . History of nephrolithiasis 01/09/2013  . Hx of migraine headaches 01/09/2013  . Mass of colon 01/09/2013  . Restless leg syndrome 01/09/2013   On Medication for this    Family History  Problem Relation Age of Onset  . Migraines Mother   . Healthy Father   . Heart Problems Maternal Grandmother   . Lung cancer Maternal Grandfather   . Cancer Paternal Grandmother         "eye cancer" dx in her 37s  . Heart Problems Paternal Grandfather   . Colon cancer Other        maternal grandmother's brother  . Colon cancer Other        maternal grandfather's sister    Past Surgical History:  Procedure Laterality Date  . LAPAROSCOPIC PARTIAL COLECTOMY N/A 01/02/2013   Procedure: LAPAROSCOPIC PROXIMAL COLECTOMY;  Surgeon: Merrie Roof, MD;  Location: [facility];  Service: General;  Laterality: N/A;  . PARTIAL COLECTOMY Right 01/02/2013   Procedure: PARTIAL COLECTOMY;  Surgeon: Merrie Roof, MD;  Location: [facility];  Service: General;  Laterality: Right;   Social History   Occupational History  . 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 Smoking status: Never Smoker  . Smokeless tobacco: Never Used  Substance and Sexual Activity  . Alcohol use: Yes    Comment: Occasionally  . Drug use: No  . Sexual activity: Yes

## 2019-05-14 ENCOUNTER — Ambulatory Visit: Payer: BC Managed Care – PPO | Admitting: Orthopaedic Surgery

## 2019-05-15 ENCOUNTER — Other Ambulatory Visit: Payer: Self-pay

## 2019-05-15 ENCOUNTER — Ambulatory Visit: Payer: BC Managed Care – PPO | Admitting: Orthopaedic Surgery

## 2019-05-15 ENCOUNTER — Encounter: Payer: Self-pay | Admitting: Orthopaedic Surgery

## 2019-05-15 DIAGNOSIS — M25522 Pain in left elbow: Secondary | ICD-10-CM

## 2019-05-15 DIAGNOSIS — M7711 Lateral epicondylitis, right elbow: Secondary | ICD-10-CM | POA: Diagnosis not present

## 2019-05-15 NOTE — Progress Notes (Signed)
Office Visit Note   Patient: Jeremiah Sanford           Date of Birth: [birth year]           MRN: [id number] Visit Date: 05/15/2019              Requested by: Gaynelle Arabian, MD [address] [place],  [place] [address] PCP: Gaynelle Arabian, MD   Assessment & Plan: Visit Diagnoses: No diagnosis found.  Plan: Discussed with him lifting techniques also some exercises he can do at home.  Offered formal physical therapy defers.  Taught to him that not having deep tissue massage he is willing to try this name of the therapist is given.  He will follow up with [place] as needed.  He did also ask about his knees clicking and popping no significant pain.  States he has moved his knees if he has been for a long time due to discomfort.  Denies any giving way of either knee.  No swelling of either knee.  Discussed with him quad strengthening exercises.  Discussed with him that he has some early chondromalacia given the slight crepitus of both knees with passive range of motion.  Otherwise has good range of motion both knees.  Follow-Up Instructions: Return if symptoms worsen or fail to improve.   Orders:  No orders of the defined types were placed in this encounter.  No orders of the defined types were placed in this encounter.     Procedures: No procedures performed   Clinical Data: No additional findings.   Subjective: Chief Complaint  Patient presents with  . Right Elbow - Pain  . Left Elbow - Pain    HPI Jeremiah Sanford comes in today due to bilateral elbow pain.  Right greater than Left elbow pain. States the injections both elbows helped for awhile but the pain has returned. No new injury.  He continues to take Aleve and that helps some.  The is tried some stretching exercises.  Applies Voltaren gel at least once a day but feels this really does not help.  Is also icing the elbow and is unsure if this is helping.  Review of Systems   Objective: Vital Signs: There were no  vitals taken for this visit.  Physical Exam Constitutional:      Appearance: He is not ill-appearing or diaphoretic.  Pulmonary:     Effort: Pulmonary effort is normal.  Neurological:     Mental Status: He is oriented to person, place, and time.  Psychiatric:        Mood and Affect: Mood normal.     Ortho Exam Bilateral elbows full range of motion.  No ecchymosis erythema or edema in either elbow.  Tenderness over the lateral epicondyle of the right elbow.  Pain with extension of the right wrist against resistance.  Resisted pronation causes pain left medial epicondyle.  He has tenderness over the left medial epicondyle no tenderness over the lateral epicondylar release left elbow.  Specialty Comments:  No specialty comments available.  Imaging: No results found.   PMFS History: Patient Active Problem List   Diagnosis Date Noted  . Migraine 04/18/2016  . Family history of malignant neoplasm of gastrointestinal tract 09/25/2013  . Colon cancer ([facility]) 01/21/2013  . Mass of colon 01/09/2013  . Hx of migraine headaches 01/09/2013  . Restless leg syndrome 01/09/2013  . History of nephrolithiasis 01/09/2013   Past Medical History:  Diagnosis Date  . Anemia   .  Cancer ([facility])   . Colon cancer ([facility])   . Headache   . History of nephrolithiasis 01/09/2013  . Hx of migraine headaches 01/09/2013  . Mass of colon 01/09/2013  . Restless leg syndrome 01/09/2013   On Medication for this    Family History  Problem Relation Age of Onset  . Migraines Mother   . Healthy Father   . Heart Problems Maternal Grandmother   . Lung cancer Maternal Grandfather   . Cancer Paternal Grandmother        "eye cancer" dx in her 28s  . Heart Problems Paternal Grandfather   . Colon cancer Other        maternal grandmother's brother  . Colon cancer Other        maternal grandfather's sister    Past Surgical History:  Procedure Laterality Date  . LAPAROSCOPIC PARTIAL COLECTOMY N/A 01/02/2013    Procedure: LAPAROSCOPIC PROXIMAL COLECTOMY;  Surgeon: Merrie Roof, MD;  Location: [facility];  Service: General;  Laterality: N/A;  . PARTIAL COLECTOMY Right 01/02/2013   Procedure: PARTIAL COLECTOMY;  Surgeon: Merrie Roof, MD;  Location: [facility];  Service: General;  Laterality: Right;   Social History   Occupational History  . 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 Smoking status: Never Smoker  . Smokeless tobacco: Never Used  Substance and Sexual Activity  . Alcohol use: Yes    Comment: Occasionally  . Drug use: No  . Sexual activity: Yes

## 2020-04-29 ENCOUNTER — Ambulatory Visit: Payer: BC Managed Care – PPO | Admitting: Orthopaedic Surgery

## 2020-04-29 ENCOUNTER — Encounter: Payer: Self-pay | Admitting: Orthopaedic Surgery

## 2020-04-29 ENCOUNTER — Other Ambulatory Visit: Payer: Self-pay

## 2020-04-29 DIAGNOSIS — M25522 Pain in left elbow: Secondary | ICD-10-CM

## 2020-04-29 DIAGNOSIS — M7712 Lateral epicondylitis, left elbow: Secondary | ICD-10-CM | POA: Diagnosis not present

## 2020-04-29 MED ORDER — METHYLPREDNISOLONE ACETATE 40 MG/ML IJ SUSP
40.0000 mg | INTRAMUSCULAR | Status: AC | PRN
  Administered 2020-04-29: 40 mg

## 2020-04-29 MED ORDER — METHYLPREDNISOLONE 4 MG PO TABS: ORAL_TABLET | ORAL | 0 refills | Status: DC

## 2020-04-29 MED ORDER — LIDOCAINE HCL 1 % IJ SOLN
1.0000 mL | INTRAMUSCULAR | Status: AC | PRN
  Administered 2020-04-29: 1 mL

## 2020-04-29 NOTE — Progress Notes (Signed)
Office Visit Note   Patient: Jeremiah Sanford           Date of Birth: 05-21-1966           MRN: [id number] Visit Date: 04/29/2020              Requested by: Gaynelle Arabian, MD [address] [place],  [place] [address] PCP: Gaynelle Arabian, MD   Assessment & Plan: Visit Diagnoses:  1. Lateral epicondylitis, left elbow     Plan: Given the severity of his lateral epicondylitis of the left elbow I recommended a steroid injection today and he agreed with this having had it before.  I am also going to try a 6-day steroid taper and is essentially get him into outpatient physical therapy for any modalities that can help decrease the pain over the lateral epicondyle of the elbow.  I would like to see him back in 6 weeks.  I would even consider repeat injection then possibly.  All questions and concerns were answered and addressed.  Follow-Up Instructions: Return in about 6 weeks (around 06/10/2020).   Orders:  Orders Placed This Encounter  Procedures  . Hand/UE Inj   No orders of the defined types were placed in this encounter.     Procedures: Hand/UE Inj: L elbow for lateral epicondylitis on 04/29/2020 8:45 AM Medications: 1 mL lidocaine 1 %; 40 mg methylPREDNISolone acetate 40 MG/ML      Clinical Data: No additional findings.   Subjective: Chief Complaint  Patient presents with  . 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  He has had this before.  He has had-year-old both his elbows as well.  The right elbow is finally improved.  He has severe pain now with his left elbow and it is getting worse.  He is waking up at night as well.  He does use an ice pack on that elbow.  He had an injection in the past but has been a year.  He said no other acute change in his medical status.  He is not a diabetic.  He does a lot of repetitive work as well.  He does report stiffness in that elbow.  HPI  Review of  Systems He currently denies any headache, chest pain, shortness of breath, fever, chills, nausea, vomiting  Objective: Vital Signs: There were no vitals taken for this visit.  Physical Exam He is alert and oriented x3 and in no acute distress Ortho Exam Examination of his left elbow shows severe pain over the lateral epicondyle to palpation.  The remainder of the elbow exam is normal other than pain. Specialty Comments:  No specialty comments available.  Imaging: No results found.   PMFS History: Patient Active Problem List   Diagnosis Date Noted  . Migraine 04/18/2016  . Family history of malignant neoplasm of gastrointestinal tract 09/25/2013  . Colon cancer ([facility]) 01/21/2013  . Mass of colon 01/09/2013  . Hx of migraine headaches 01/09/2013  . Restless leg syndrome 01/09/2013  . History of nephrolithiasis 01/09/2013   Past Medical History:  Diagnosis Date  . Anemia   . Cancer ([facility])   . Colon cancer ([facility])   . Headache   . History of nephrolithiasis 01/09/2013  . Hx of migraine headaches 01/09/2013  . Mass of colon 01/09/2013  . Restless leg syndrome 01/09/2013   On Medication for this    Family History  Problem Relation Age  of Onset  . Migraines Mother   . Healthy Father   . Heart Problems Maternal Grandmother   . Lung cancer Maternal Grandfather   . Cancer Paternal Grandmother        "eye cancer" dx in her 17s  . Heart Problems Paternal Grandfather   . Colon cancer Other        maternal grandmother's brother  . Colon cancer Other        maternal grandfather's sister    Past Surgical History:  Procedure Laterality Date  . LAPAROSCOPIC PARTIAL COLECTOMY N/A 01/02/2013   Procedure: LAPAROSCOPIC PROXIMAL COLECTOMY;  Surgeon: Merrie Roof, MD;  Location: [facility];  Service: General;  Laterality: N/A;  . PARTIAL COLECTOMY Right 01/02/2013   Procedure: PARTIAL COLECTOMY;  Surgeon: Merrie Roof, MD;  Location: [facility];  Service: General;  Laterality: Right;   Social  History   Occupational History  . 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 Smoking status: Never Smoker  . Smokeless tobacco: Never Used  Vaping Use  . Vaping Use: Never used  Substance and Sexual Activity  . Alcohol use: Yes    Comment: Occasionally  . Drug use: No  . Sexual activity: Yes

## 2020-05-12 ENCOUNTER — Telehealth: Payer: Self-pay | Admitting: Orthopaedic Surgery

## 2020-05-12 DIAGNOSIS — M7712 Lateral epicondylitis, left elbow: Secondary | ICD-10-CM

## 2020-05-12 NOTE — Telephone Encounter (Signed)
Patient called. He would like a referral for PT here at [facility]. His call back number is [phone]

## 2020-05-12 NOTE — Telephone Encounter (Signed)
PT order placed in chart 

## 2020-05-12 NOTE — Telephone Encounter (Signed)
Pt advised and stated understanding

## 2020-05-12 NOTE — Telephone Encounter (Signed)
I am fine with him being set up for outpatient physical therapy here to treat his elbow lateral epicondylitis with any modalities per the therapist.

## 2020-05-18 ENCOUNTER — Ambulatory Visit: Payer: BC Managed Care – PPO | Admitting: Physical Therapy

## 2020-05-18 ENCOUNTER — Other Ambulatory Visit: Payer: Self-pay

## 2020-05-18 ENCOUNTER — Encounter: Payer: Self-pay | Admitting: Physical Therapy

## 2020-05-18 DIAGNOSIS — M25522 Pain in left elbow: Secondary | ICD-10-CM

## 2020-05-18 DIAGNOSIS — M25622 Stiffness of left elbow, not elsewhere classified: Secondary | ICD-10-CM | POA: Diagnosis not present

## 2020-05-18 DIAGNOSIS — M6281 Muscle weakness (generalized): Secondary | ICD-10-CM

## 2020-05-18 DIAGNOSIS — R600 Localized edema: Secondary | ICD-10-CM

## 2020-05-18 NOTE — Therapy (Signed)
[facility] Physical Therapy [address] [place], [place], [address] Phone: [phone]   Fax:  [phone]  Physical Therapy Evaluation  Patient Details  Name: Jeremiah Sanford MRN: [id number] Date of Birth: August 29, 1965 Referring Provider (PT): Jean Rosenthal MD   Encounter Date: 05/18/2020   PT End of Session - 05/18/20 1623    Visit Number 1    Number of Visits 9    Date for PT Re-Evaluation 06/29/20    PT Start Time 1300    PT Stop Time 1345    PT Time Calculation (min) 45 min    Activity Tolerance Patient tolerated treatment well    Behavior During Therapy [facility] for tasks assessed/performed           Past Medical History:  Diagnosis Date  . Anemia   . Cancer ([place])   . Colon cancer ([place])   . Headache   . History of nephrolithiasis 01/09/2013  . Hx of migraine headaches 01/09/2013  . Mass of colon 01/09/2013  . Restless leg syndrome 01/09/2013   On Medication for this    Past Surgical History:  Procedure Laterality Date  . LAPAROSCOPIC PARTIAL COLECTOMY N/A 01/02/2013   Procedure: LAPAROSCOPIC PROXIMAL COLECTOMY;  Surgeon: Merrie Roof, MD;  Location: [place];  Service: General;  Laterality: N/A;  . PARTIAL COLECTOMY Right 01/02/2013   Procedure: PARTIAL COLECTOMY;  Surgeon: Merrie Roof, MD;  Location: [place];  Service: General;  Laterality: Right;    There were no vitals filed for this visit.    Subjective Assessment - 05/18/20 1307    Subjective Pt arriving to therapy dx of lateral epicondylitis in bilateral elbows, but reporting his R arm has no pain, but his left pain can increase to  8/10. Pt stating it bagan around 1 year ago, but recently the left elbow began hurting again. Pt reports his sleep is affected. 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    Pertinent History colon CA, migraines, nephrolithiasis, anemia    Limitations Lifting;House hold activities    Patient Stated Goals Be abe to use my arm without pain     Currently in Pain? Yes    Pain Score 8     Pain Location Elbow    Pain Orientation Left    Pain Descriptors / Indicators Aching    Pain Type Chronic pain    Pain Onset More than a month ago    Pain Frequency Intermittent    Aggravating Factors  certain movements, sleeping    Pain Relieving Factors brace, ice, over the counter pain meds, but nothing seems to help.    Effect of Pain on Daily Activities difficulty with ALD's, sleeping              [facility] PT Assessment - 05/18/20 0001      Assessment   Medical Diagnosis m77.12 lateral epicondylitis    Referring Provider (PT) Jean Rosenthal MD    Onset Date/Surgical Date --   about 1 year ago   Hand Dominance Right    Prior Therapy 30 years ago for back pain      Precautions   Precautions None      Restrictions   Weight Bearing Restrictions No      Balance Screen   Has the patient fallen in the past 6 months Yes    How many times? 1    Is the patient reluctant to leave their home because of a fear of falling?  No      Home Environment   Living Environment Private residence    Living Arrangements Spouse/significant other    Type of [place] Access Level entry      Prior Function   Level of [place] work on cars      Cognition   Overall Cognitive Status Within [place] for tasks assessed      Observation/Other Assessments   Focus on Therapeutic Outcomes (FOTO)  33% limitation, predicted 29% limitation      Observation/Other Assessments-Edema    Edema Circumferential      Circumferential Edema   Circumferential - Left  2 centimeters larger than Right elbow      Posture/Postural Control   Posture/Postural Control Postural limitations    Postural Limitations Rounded Shoulders;Forward head      ROM / Strength   AROM / PROM / Strength AROM;Strength      AROM   AROM Assessment Site Elbow    Right/Left Elbow Right;Left    Right Elbow Flexion 140    Right Elbow  Extension 0    Left Elbow Flexion -2    Left Elbow Extension 125   increased pain     Strength   Overall Strength Comments R grip: 92ppsi, L grip 91.5ppsi    Strength Assessment Site Elbow    Right/Left Elbow Right;Left    Right Elbow Flexion 5/5    Right Elbow Extension 5/5    Left Elbow Flexion 4+/5    Left Elbow Extension 4+/5      Palpation   Palpation comment TTP: lateral epicondyle       Transfers   Five time sit to stand comments  [facility]      Ambulation/Gait   Gait Comments decreased arm swing on L UE                      Objective measurements completed on examination: See above findings.       [facility] Adult PT Treatment/Exercise - 05/18/20 0001      Modalities   Modalities Iontophoresis      Iontophoresis   Type of Iontophoresis Dexamethasone    Location left elbow    Dose 30ml    Time 4-6 hour wear time patch                  PT Education - 05/18/20 1622    Education Details PT POC, Ionto wearing instrutions, HEP    Person(s) Educated Patient    Methods Explanation;Demonstration;Tactile cues;Verbal cues    Comprehension Verbalized understanding;Returned demonstration            PT Short Term Goals - 05/18/20 1601      PT SHORT TERM GOAL #1   Title Pt will be independent in his intial HEP    Time 3    Period Weeks    Status New    Target Date 06/08/20      PT SHORT TERM GOAL #2   Title Pt will be able to report pain </= 5/10 with ADL's.    Baseline pain can increase to 8/10 at times.    Time 3    Period Weeks    Status New             PT Long Term Goals - 05/18/20 1603      PT LONG TERM GOAL #1   Title pt will improve his FOTO from 41%  limitation to </= 31% limitation.    Time 6    Period Weeks    Status New    Target Date 06/29/20      PT LONG TERM GOAL #2   Title Pt will be able to lift 10# from counter height and place on shelf over head with pain</= 2/10 using L UE.    Time 6    Period Weeks    Status New     Target Date 06/29/20      PT LONG TERM GOAL #3   Title Pt will improve his R elbow flexion/extension strength to 5/5.    Time 6    Period Weeks    Status New    Target Date 06/29/20                  Plan - 05/18/20 1611    Clinical Impression Statement Pt arriving to therapy for evaluation of left lateral epicondylitis which has been ongoing for about a year. Pt stated that it was improving but recently took a turn for the worse and his pain has been increasing. Pt with 2 centimeter different in circumference in L elbow compared to right. Pt with mild weakness noted when compared to right and decreased ROM. Pt reporting pain can increase to 5/46 with certain movements. Ionto patch applied and pt instructed to take off in 4-6 hours. Skilled PT needed to address pt's impairments with below inteventions.    Personal Factors and Comorbidities Comorbidity 3+    Comorbidities colon CA, HA, migraines, nephrolithiasis, anemia    Examination-Activity Limitations Lift;Carry    Examination-Participation Restrictions Tour manager    Stability/Clinical Decision Making Stable/Uncomplicated    Clinical Decision Making Low    Rehab Potential Excellent    PT Frequency Other (comment)   2x/ week progressing to 1 x/ week   PT Duration 6 weeks    PT Treatment/Interventions Iontophoresis 4mg /ml Dexamethasone;Cryotherapy;Electrical Stimulation;Functional mobility training;Therapeutic activities;Therapeutic exercise;Neuromuscular re-education;Patient/family education;Manual techniques;Passive range of motion;Dry needling;Taping    PT Next Visit Plan respone to IONTO, elbow stretching, mobs, mild strengthening, combo E-stim/US, other modalities as needed    PT Home Exercise Plan [email or web address]    Consulted and Agree with Plan of Care Patient           Patient will benefit from skilled therapeutic intervention in order to improve the following deficits and impairments:  Pain, Impaired flexibility,  Decreased strength, Impaired UE functional use  Visit Diagnosis: Pain in left elbow  Stiffness of left elbow, not elsewhere classified  Muscle weakness (generalized)  Localized edema     Problem List Patient Active Problem List   Diagnosis Date Noted  . Migraine 04/18/2016  . Family history of malignant neoplasm of gastrointestinal tract 09/25/2013  . Colon cancer ([place]) 01/21/2013  . Mass of colon 01/09/2013  . Hx of migraine headaches 01/09/2013  . Restless leg syndrome 01/09/2013  . History of nephrolithiasis 01/09/2013    Oretha Caprice, PT, MPT 05/18/2020, 4:27 PM  [facility] Physical Therapy [address] [place], [place], [address] Phone: [phone]   Fax:  [phone]  Name: Jeremiah Sanford MRN: [id number] Date of Birth: Apr 04, 1966

## 2020-05-18 NOTE — Patient Instructions (Signed)

## 2020-05-20 ENCOUNTER — Ambulatory Visit: Payer: BC Managed Care – PPO | Admitting: Physical Therapy

## 2020-05-20 ENCOUNTER — Encounter: Payer: Self-pay | Admitting: Physical Therapy

## 2020-05-20 ENCOUNTER — Other Ambulatory Visit: Payer: Self-pay

## 2020-05-20 DIAGNOSIS — R600 Localized edema: Secondary | ICD-10-CM

## 2020-05-20 DIAGNOSIS — M25522 Pain in left elbow: Secondary | ICD-10-CM | POA: Diagnosis not present

## 2020-05-20 DIAGNOSIS — M6281 Muscle weakness (generalized): Secondary | ICD-10-CM | POA: Diagnosis not present

## 2020-05-20 DIAGNOSIS — M25622 Stiffness of left elbow, not elsewhere classified: Secondary | ICD-10-CM | POA: Diagnosis not present

## 2020-05-20 NOTE — Patient Instructions (Signed)
Access Code: [email or web address] URL: [email or web address] Date: 05/20/2020 Prepared by: Faustino Congress  Exercises Seated Wrist Flexion Active Stretch with Elbow Straight - 3 x daily - 7 x weekly - 10 reps - 5 seconds hold Elbow Distraction Self Mobilization - 3 x daily - 7 x weekly - 10 reps - 5 seconds hold Seated Wrist Extension AROM - 3 x daily - 7 x weekly - 10 reps - 5 seconds hold Forearm Pronation and Supination with Hammer - 3 x daily - 7 x weekly - 10 reps Standing Wrist Flexion Stretch - 2 x daily - 7 x weekly - 1 sets - 3 reps - 30 sec hold  Patient Education Trigger Point Dry Needling

## 2020-05-20 NOTE — Therapy (Signed)
[facility] Physical Therapy [address] [place], [place], [address] Phone: [phone]   Fax:  [phone]  Physical Therapy Treatment  Patient Details  Name: Jeremiah Sanford MRN: [id number] Date of Birth: 1965/08/28 Referring Provider (PT): Jean Rosenthal MD   Encounter Date: 05/20/2020   PT End of Session - 05/20/20 1555    Visit Number 2    Number of Visits 9    Date for PT Re-Evaluation 06/29/20    PT Start Time 7017    PT Stop Time 1552    PT Time Calculation (min) 37 min    Activity Tolerance Patient tolerated treatment well    Behavior During Therapy [facility] for tasks assessed/performed           Past Medical History:  Diagnosis Date  . Anemia   . Cancer ([place])   . Colon cancer ([place])   . Headache   . History of nephrolithiasis 01/09/2013  . Hx of migraine headaches 01/09/2013  . Mass of colon 01/09/2013  . Restless leg syndrome 01/09/2013   On Medication for this    Past Surgical History:  Procedure Laterality Date  . LAPAROSCOPIC PARTIAL COLECTOMY N/A 01/02/2013   Procedure: LAPAROSCOPIC PROXIMAL COLECTOMY;  Surgeon: Merrie Roof, MD;  Location: [place];  Service: General;  Laterality: N/A;  . PARTIAL COLECTOMY Right 01/02/2013   Procedure: PARTIAL COLECTOMY;  Surgeon: Merrie Roof, MD;  Location: [place];  Service: General;  Laterality: Right;    There were no vitals filed for this visit.   Subjective Assessment - 05/20/20 1518    Subjective doing exercises 3x/day, but pain seems a little worse now.  thinks ionto was helpful intially    Pertinent History colon CA, migraines, nephrolithiasis, anemia    Limitations Lifting;House hold activities    Patient Stated Goals Be abe to use my arm without pain    Currently in Pain? Yes    Pain Onset More than a month ago                             [facility] Adult PT Treatment/Exercise - 05/20/20 1523      Exercises   Exercises Elbow      Elbow Exercises   Elbow  Extension Left;20 reps    Bar Weights/Barbell (Elbow Extension) 2 lbs    Elbow Extension Limitations slow eccentrics    Forearm Supination 20 reps;Left    Bar Weights/Barbell (Forearm Supination) 2 lbs    Forearm Pronation 20 reps;Left    Bar Weights/Barbell (Forearm Pronation) 2 lbs    Other elbow exercises wrist extensor stretch 3x30 sec, left    Other elbow exercises ulnar/radial deviation x 20  reps each way, 2# in pronation      Modalities   Modalities Iontophoresis      Iontophoresis   Type of Iontophoresis Dexamethasone    Location left elbow    Dose 23ml    Time 4-6 hour wear time patch      Manual Therapy   Manual therapy comments compression to Lt wrist extensors            Trigger Point Dry Needling - 05/20/20 1539    Consent Given? Yes    Education Handout Provided Yes    Muscles Treated Wrist/Hand Extensor digitorum;Extensor carpi radialis longus/brevis    Electrical Stimulation Performed with Dry Needling Yes    E-stim with Dry Needling Details to tolerance to wrist extensors  x 5 min    Extensor carpi radialis longus/brevis Response Twitch response elicited    Extensor digitorum Response Twitch response elicited                  PT Short Term Goals - 05/18/20 1601      PT SHORT TERM GOAL #1   Title Pt will be independent in his intial HEP    Time 3    Period Weeks    Status New    Target Date 06/08/20      PT SHORT TERM GOAL #2   Title Pt will be able to report pain </= 5/10 with ADL's.    Baseline pain can increase to 8/10 at times.    Time 3    Period Weeks    Status New             PT Long Term Goals - 05/18/20 1603      PT LONG TERM GOAL #1   Title pt will improve his FOTO from 41% limitation to </= 31% limitation.    Time 6    Period Weeks    Status New    Target Date 06/29/20      PT LONG TERM GOAL #2   Title Pt will be able to lift 10# from counter height and place on shelf over head with pain</= 2/10 using L UE.    Time  6    Period Weeks    Status New    Target Date 06/29/20      PT LONG TERM GOAL #3   Title Pt will improve his R elbow flexion/extension strength to 5/5.    Time 6    Period Weeks    Status New    Target Date 06/29/20                 Plan - 05/20/20 1555    Clinical Impression Statement Repeated ionto today, and trial of DN and estim today with slight reduction in pain following session.  Will continue to benefit from PT to maximize function.  All goals ongoing at this time.    Personal Factors and Comorbidities Comorbidity 3+    Comorbidities colon CA, HA, migraines, nephrolithiasis, anemia    Examination-Activity Limitations Lift;Carry    Examination-Participation Restrictions Yard Work;Other    Stability/Clinical Decision Making Stable/Uncomplicated    Rehab Potential Excellent    PT Frequency Other (comment)   2x/ week progressing to 1 x/ week   PT Duration 6 weeks    PT Treatment/Interventions Iontophoresis 4mg /ml Dexamethasone;Cryotherapy;Electrical Stimulation;Functional mobility training;Therapeutic activities;Therapeutic exercise;Neuromuscular re-education;Patient/family education;Manual techniques;Passive range of motion;Dry needling;Taping    PT Next Visit Plan respone to IONTO #2, elbow stretching, mobs, mild strengthening, combo E-stim/US, other modalities as needed, assess response to DN    PT Home Exercise Plan [facility]    Consulted and Agree with Plan of Care Patient           Patient will benefit from skilled therapeutic intervention in order to improve the following deficits and impairments:  Pain, Impaired flexibility, Decreased strength, Impaired UE functional use  Visit Diagnosis: Pain in left elbow  Stiffness of left elbow, not elsewhere classified  Muscle weakness (generalized)  Localized edema     Problem List Patient Active Problem List   Diagnosis Date Noted  . Migraine 04/18/2016  . Family history of malignant neoplasm of  gastrointestinal tract 09/25/2013  . Colon cancer ([place]) 01/21/2013  . Mass of colon 01/09/2013  .  Hx of migraine headaches 01/09/2013  . Restless leg syndrome 01/09/2013  . History of nephrolithiasis 01/09/2013      Laureen Abrahams, PT, DPT 05/20/20 3:57 PM    [facility] Physical Therapy [address] [place], [place], [address] Phone: [phone]   Fax:  [phone]  Name: Jeremiah Sanford MRN: [id number] Date of Birth: [birth year]

## 2020-05-26 ENCOUNTER — Other Ambulatory Visit: Payer: Self-pay

## 2020-05-26 ENCOUNTER — Ambulatory Visit: Payer: BC Managed Care – PPO | Admitting: Physical Therapy

## 2020-05-26 DIAGNOSIS — R600 Localized edema: Secondary | ICD-10-CM | POA: Diagnosis not present

## 2020-05-26 DIAGNOSIS — M25622 Stiffness of left elbow, not elsewhere classified: Secondary | ICD-10-CM

## 2020-05-26 DIAGNOSIS — M6281 Muscle weakness (generalized): Secondary | ICD-10-CM | POA: Diagnosis not present

## 2020-05-26 DIAGNOSIS — M25522 Pain in left elbow: Secondary | ICD-10-CM | POA: Diagnosis not present

## 2020-05-26 NOTE — Therapy (Signed)
[facility] Physical Therapy [address] [place], [place], [address] Phone: [phone]   Fax:  [phone]  Physical Therapy Treatment  Patient Details  Name: Jeremiah Sanford MRN: [id number] Date of Birth: 08-14-1965 Referring Provider (PT): Jean Rosenthal MD   Encounter Date: 05/26/2020   PT End of Session - 05/26/20 1703    Visit Number 3    Number of Visits 9    Date for PT Re-Evaluation 06/29/20    PT Start Time 7622    PT Stop Time 1600    PT Time Calculation (min) 45 min    Activity Tolerance Patient tolerated treatment well    Behavior During Therapy [facility] for tasks assessed/performed           Past Medical History:  Diagnosis Date  . Anemia   . Cancer ([place])   . Colon cancer ([place])   . Headache   . History of nephrolithiasis 01/09/2013  . Hx of migraine headaches 01/09/2013  . Mass of colon 01/09/2013  . Restless leg syndrome 01/09/2013   On Medication for this    Past Surgical History:  Procedure Laterality Date  . LAPAROSCOPIC PARTIAL COLECTOMY N/A 01/02/2013   Procedure: LAPAROSCOPIC PROXIMAL COLECTOMY;  Surgeon: Merrie Roof, MD;  Location: [place];  Service: General;  Laterality: N/A;  . PARTIAL COLECTOMY Right 01/02/2013   Procedure: PARTIAL COLECTOMY;  Surgeon: Merrie Roof, MD;  Location: [place];  Service: General;  Laterality: Right;    There were no vitals filed for this visit.   Subjective Assessment - 05/26/20 1658    Subjective the DN helped some that night but pain is a little worse today and has been using home TENS unit on it    Pertinent History colon CA, migraines, nephrolithiasis, anemia    Limitations Lifting;House hold activities    Patient Stated Goals Be abe to use my arm without pain    Pain Onset More than a month ago                             [facility] Adult PT Treatment/Exercise - 05/26/20 0001      Elbow Exercises   Forearm Supination 20 reps;10 reps    Bar Weights/Barbell  (Forearm Supination) 2 lbs    Forearm Supination Limitations with cupping    Forearm Pronation 20 reps;Left    Bar Weights/Barbell (Forearm Pronation) 2 lbs    Forearm Pronation Limitations with cupping    Other elbow exercises wrist extensor stretch 3x30 sec, left and wrist flexor stretch 30 sec X 3 left    Other elbow exercises wrist flexion and wrist extensoin 2 lbs X 10 reps with cupping, gripping 20 reps with cupping      Manual Therapy   Manual therapy comments IASTM and cupping to lef elbow, triceps, wrist extensors. Cupping with active movements and gripping. Ice massage 5 min to left elbow. Leukotape around Lt forearm to simulate tennis elbow strap'                  PT Education - 05/26/20 1702    Education Details tennis elbow strap recommendation    Person(s) Educated Patient    Methods Explanation    Comprehension Verbalized understanding            PT Short Term Goals - 05/18/20 1601      PT SHORT TERM GOAL #1   Title Pt will be independent in  his intial HEP    Time 3    Period Weeks    Status New    Target Date 06/08/20      PT SHORT TERM GOAL #2   Title Pt will be able to report pain </= 5/10 with ADL's.    Baseline pain can increase to 8/10 at times.    Time 3    Period Weeks    Status New             PT Long Term Goals - 05/18/20 1603      PT LONG TERM GOAL #1   Title pt will improve his FOTO from 41% limitation to </= 31% limitation.    Time 6    Period Weeks    Status New    Target Date 06/29/20      PT LONG TERM GOAL #2   Title Pt will be able to lift 10# from counter height and place on shelf over head with pain</= 2/10 using L UE.    Time 6    Period Weeks    Status New    Target Date 06/29/20      PT LONG TERM GOAL #3   Title Pt will improve his R elbow flexion/extension strength to 5/5.    Time 6    Period Weeks    Status New    Target Date 06/29/20                 Plan - 05/26/20 1703    Clinical Impression  Statement He appeared to have more inflammaiton and pain today so held off on some of the strengthening and instead focused more on manual therapy. Trialed ice massage, cupping, IASTM, and simulated tennis elbow strap today in efforts to reduce pain.    Personal Factors and Comorbidities Comorbidity 3+    Comorbidities colon CA, HA, migraines, nephrolithiasis, anemia    Examination-Activity Limitations Lift;Carry    Examination-Participation Restrictions Yard Work;Other    Stability/Clinical Decision Making Stable/Uncomplicated    Rehab Potential Excellent    PT Frequency Other (comment)   2x/ week progressing to 1 x/ week   PT Duration 6 weeks    PT Treatment/Interventions Iontophoresis 4mg /ml Dexamethasone;Cryotherapy;Electrical Stimulation;Functional mobility training;Therapeutic activities;Therapeutic exercise;Neuromuscular re-education;Patient/family education;Manual techniques;Passive range of motion;Dry needling;Taping    PT Next Visit Plan response to last visit?    PT Home Exercise Plan [email or web address]    Consulted and Agree with Plan of Care Patient           Patient will benefit from skilled therapeutic intervention in order to improve the following deficits and impairments:  Pain, Impaired flexibility, Decreased strength, Impaired UE functional use  Visit Diagnosis: Pain in left elbow  Stiffness of left elbow, not elsewhere classified  Muscle weakness (generalized)  Localized edema     Problem List Patient Active Problem List   Diagnosis Date Noted  . Migraine 04/18/2016  . Family history of malignant neoplasm of gastrointestinal tract 09/25/2013  . Colon cancer ([place]) 01/21/2013  . Mass of colon 01/09/2013  . Hx of migraine headaches 01/09/2013  . Restless leg syndrome 01/09/2013  . History of nephrolithiasis 01/09/2013    Silvestre Mesi 05/26/2020, 5:05 PM  [facility] Physical Therapy [address] [place], [place], [address] Phone:  [phone]   Fax:  [phone]  Name: Jeremiah Sanford MRN: [id number] Date of Birth: 1966-05-15

## 2020-05-29 ENCOUNTER — Ambulatory Visit: Payer: BC Managed Care – PPO | Admitting: Physical Therapy

## 2020-05-29 ENCOUNTER — Other Ambulatory Visit: Payer: Self-pay

## 2020-05-29 DIAGNOSIS — M25522 Pain in left elbow: Secondary | ICD-10-CM

## 2020-05-29 DIAGNOSIS — R600 Localized edema: Secondary | ICD-10-CM

## 2020-05-29 DIAGNOSIS — M6281 Muscle weakness (generalized): Secondary | ICD-10-CM | POA: Diagnosis not present

## 2020-05-29 DIAGNOSIS — M25622 Stiffness of left elbow, not elsewhere classified: Secondary | ICD-10-CM

## 2020-05-29 NOTE — Therapy (Signed)
[facility] Physical Therapy [address] [place], [place], [address] Phone: [phone]   Fax:  [phone]  Physical Therapy Treatment  Patient Details  Name: Jeremiah Sanford MRN: [id number] Date of Birth: 05-08-1966 Referring Provider (PT): Jean Rosenthal MD   Encounter Date: 05/29/2020   PT End of Session - 05/29/20 0948    Visit Number 4    Number of Visits 9    Date for PT Re-Evaluation 06/29/20    PT Start Time 0850    PT Stop Time 0940    PT Time Calculation (min) 50 min    Activity Tolerance Patient tolerated treatment well    Behavior During Therapy [facility] for tasks assessed/performed           Past Medical History:  Diagnosis Date  . Anemia   . Cancer ([place])   . Colon cancer ([place])   . Headache   . History of nephrolithiasis 01/09/2013  . Hx of migraine headaches 01/09/2013  . Mass of colon 01/09/2013  . Restless leg syndrome 01/09/2013   On Medication for this    Past Surgical History:  Procedure Laterality Date  . LAPAROSCOPIC PARTIAL COLECTOMY N/A 01/02/2013   Procedure: LAPAROSCOPIC PROXIMAL COLECTOMY;  Surgeon: Merrie Roof, MD;  Location: [place];  Service: General;  Laterality: N/A;  . PARTIAL COLECTOMY Right 01/02/2013   Procedure: PARTIAL COLECTOMY;  Surgeon: Merrie Roof, MD;  Location: [place];  Service: General;  Laterality: Right;    There were no vitals filed for this visit.     [place] Adult PT Treatment/Exercise - 05/29/20 0001      Elbow Exercises   Forearm Supination --    Bar Weights/Barbell (Forearm Supination) --    Forearm Pronation --    Bar Weights/Barbell (Forearm Pronation) --    Other elbow exercises UBE L3 2 min fwd, 2 min revers. wrist extensor stretch 3x30 sec, left and wrist flexor stretch 30 sec X 3 left    Other elbow exercises wrist flexion and wrist extensoin 2 lbs X 10 reps with cupping, gripping 20 reps X 2 with cupping, wrist extension 2 sets of 10 with cupping      Manual Therapy   Manual  therapy comments IASTM and cupping to lef elbow, triceps, wrist extensors. Cupping with active movements and gripping. Ice massage 5 min to left elbow.KT tape around Lt forearm to simulate tennis elbow strap. Elbow radial head glides and A-P elbow glides grade 2-3.                    PT Short Term Goals - 05/18/20 1601      PT SHORT TERM GOAL #1   Title Pt will be independent in his intial HEP    Time 3    Period Weeks    Status New    Target Date 06/08/20      PT SHORT TERM GOAL #2   Title Pt will be able to report pain </= 5/10 with ADL's.    Baseline pain can increase to 8/10 at times.    Time 3    Period Weeks    Status New             PT Long Term Goals - 05/18/20 1603      PT LONG TERM GOAL #1   Title pt will improve his FOTO from 41% limitation to </= 31% limitation.    Time 6    Period Weeks  Status New    Target Date 06/29/20      PT LONG TERM GOAL #2   Title Pt will be able to lift 10# from counter height and place on shelf over head with pain</= 2/10 using L UE.    Time 6    Period Weeks    Status New    Target Date 06/29/20      PT LONG TERM GOAL #3   Title Pt will improve his R elbow flexion/extension strength to 5/5.    Time 6    Period Weeks    Status New    Target Date 06/29/20                 Plan - 05/29/20 0949    Clinical Impression Statement It appeared the manual therapy helped after last time but then the pain came back last night and he is not sure why. Then today then manual therapy did not help resolve any of his symptoms. Encouraged rest, ice massage, Self STM at home and he will follow up with MD next week. Progress has been minimal and temporary at this point so far with PT.    Personal Factors and Comorbidities Comorbidity 3+    Comorbidities colon CA, HA, migraines, nephrolithiasis, anemia    Examination-Activity Limitations Lift;Carry    Examination-Participation Restrictions Yard Work;Other     Stability/Clinical Decision Making Stable/Uncomplicated    Rehab Potential Excellent    PT Frequency Other (comment)   2x/ week progressing to 1 x/ week   PT Duration 6 weeks    PT Treatment/Interventions Iontophoresis 4mg /ml Dexamethasone;Cryotherapy;Electrical Stimulation;Functional mobility training;Therapeutic activities;Therapeutic exercise;Neuromuscular re-education;Patient/family education;Manual techniques;Passive range of motion;Dry needling;Taping    PT Next Visit Plan response to last visit? sees MD next week so can await further recommendations    PT Home Exercise Plan [facility]    Consulted and Agree with Plan of Care Patient           Patient will benefit from skilled therapeutic intervention in order to improve the following deficits and impairments:  Pain,Impaired flexibility,Decreased strength,Impaired UE functional use  Visit Diagnosis: Pain in left elbow  Stiffness of left elbow, not elsewhere classified  Muscle weakness (generalized)  Localized edema     Problem List Patient Active Problem List   Diagnosis Date Noted  . Migraine 04/18/2016  . Family history of malignant neoplasm of gastrointestinal tract 09/25/2013  . Colon cancer ([place]) 01/21/2013  . Mass of colon 01/09/2013  . Hx of migraine headaches 01/09/2013  . Restless leg syndrome 01/09/2013  . History of nephrolithiasis 01/09/2013    Silvestre Mesi 05/29/2020, 9:52 AM  [facility] Physical Therapy [address] [place], [place], [address] Phone: [phone]   Fax:  [phone]  Name: Jeremiah Sanford MRN: [id number] Date of Birth: December 05, 1965

## 2020-06-02 ENCOUNTER — Ambulatory Visit: Payer: BC Managed Care – PPO | Admitting: Physical Therapy

## 2020-06-02 ENCOUNTER — Other Ambulatory Visit: Payer: Self-pay

## 2020-06-02 DIAGNOSIS — M25522 Pain in left elbow: Secondary | ICD-10-CM

## 2020-06-02 DIAGNOSIS — M25622 Stiffness of left elbow, not elsewhere classified: Secondary | ICD-10-CM | POA: Diagnosis not present

## 2020-06-02 DIAGNOSIS — M6281 Muscle weakness (generalized): Secondary | ICD-10-CM

## 2020-06-02 NOTE — Therapy (Signed)
[facility] Physical Therapy [address] [place], [place], [address] Phone: [phone]   Fax:  [phone]  Physical Therapy Treatment  Patient Details  Name: Jeremiah Sanford MRN: [id number] Date of Birth: Sep 02, 1965 Referring Provider (PT): Jean Rosenthal MD   Encounter Date: 06/02/2020   PT End of Session - 06/02/20 1041    Visit Number 5    Number of Visits 9    Date for PT Re-Evaluation 06/29/20    PT Start Time 0930    PT Stop Time 1025    PT Time Calculation (min) 55 min    Activity Tolerance Patient tolerated treatment well    Behavior During Therapy [facility] for tasks assessed/performed           Past Medical History:  Diagnosis Date   Anemia    Cancer ([place])    Colon cancer ([place])    Headache    History of nephrolithiasis 01/09/2013   Hx of migraine headaches 01/09/2013   Mass of colon 01/09/2013   Restless leg syndrome 01/09/2013   On Medication for this    Past Surgical History:  Procedure Laterality Date   LAPAROSCOPIC PARTIAL COLECTOMY N/A 01/02/2013   Procedure: LAPAROSCOPIC PROXIMAL COLECTOMY;  Surgeon: Merrie Roof, MD;  Location: [place];  Service: General;  Laterality: N/A;   PARTIAL COLECTOMY Right 01/02/2013   Procedure: PARTIAL COLECTOMY;  Surgeon: Merrie Roof, MD;  Location: [place];  Service: General;  Laterality: Right;    There were no vitals filed for this visit.   Subjective Assessment - 06/02/20 1034    Subjective relays the pain has not improved and has been really bad since sunday night.    Pertinent History colon CA, migraines, nephrolithiasis, anemia    Limitations Lifting;House hold activities    Patient Stated Goals Be abe to use my arm without pain    Pain Onset More than a month ago                             [facility] Adult PT Treatment/Exercise - 06/02/20 0001      Elbow Exercises   Forearm Supination AROM;20 reps    Forearm Supination Limitations with cupping    Forearm  Pronation AROM;20 reps    Forearm Pronation Limitations with cupping    Other elbow exercises stretching for wrist extensors and wrist flexors 30 sec X 2 ea      Modalities   Modalities Iontophoresis;Ultrasound      Ultrasound   Ultrasound Location Lt lateral elbow    Ultrasound Parameters 1 mhz, 100%, 1.0 w/cm2 10 min with biofreeze    Ultrasound Goals Pain      Iontophoresis   Type of Iontophoresis Dexamethasone    Location left elbow    Dose 93ml    Time 4-6 hour wear time patch      Manual Therapy   Manual therapy comments IASTM and cupping to lef elbow, triceps, wrist extensors. Cupping with active movements and gripping. Ice massage 5 min to left elbow.Leuko tape around Lt forearm to simulate tennis elbow strap.                    PT Short Term Goals - 05/18/20 1601      PT SHORT TERM GOAL #1   Title Pt will be independent in his intial HEP    Time 3    Period Weeks    Status New  Target Date 06/08/20      PT SHORT TERM GOAL #2   Title Pt will be able to report pain </= 5/10 with ADL's.    Baseline pain can increase to 8/10 at times.    Time 3    Period Weeks    Status New             PT Long Term Goals - 05/18/20 1603      PT LONG TERM GOAL #1   Title pt will improve his FOTO from 41% limitation to </= 31% limitation.    Time 6    Period Weeks    Status New    Target Date 06/29/20      PT LONG TERM GOAL #2   Title Pt will be able to lift 10# from counter height and place on shelf over head with pain</= 2/10 using L UE.    Time 6    Period Weeks    Status New    Target Date 06/29/20      PT LONG TERM GOAL #3   Title Pt will improve his R elbow flexion/extension strength to 5/5.    Time 6    Period Weeks    Status New    Target Date 06/29/20                 Plan - 06/02/20 1043    Clinical Impression Statement He continues to have high irritability of symptoms and PT has only helped temporarily at this point. Trialed U.S  today with biofreeze to see if this helpes with inflammation. Of note he does have 3/8 of an inch of edema in his left lateral elbow just inferior to lateral epicondyle (11 inch on Rt and 11 3/8 inch on left). KT tape tennis elbow strap simulation did not help as much as leukotape so went back to leukotape today for this. He will follow up with MD next week.    Personal Factors and Comorbidities Comorbidity 3+    Comorbidities colon CA, HA, migraines, nephrolithiasis, anemia    Examination-Activity Limitations Lift;Carry    Examination-Participation Restrictions Yard Work;Other    Stability/Clinical Decision Making Stable/Uncomplicated    Rehab Potential Excellent    PT Frequency Other (comment)   2x/ week progressing to 1 x/ week   PT Duration 6 weeks    PT Treatment/Interventions Iontophoresis 4mg /ml Dexamethasone;Cryotherapy;Electrical Stimulation;Functional mobility training;Therapeutic activities;Therapeutic exercise;Neuromuscular re-education;Patient/family education;Manual techniques;Passive range of motion;Dry needling;Taping    PT Next Visit Plan response to last visit? sees MD next week so can await further recommendations but may need to discharge due to lack of progress    PT Home Exercise Plan [facility]    Consulted and Agree with Plan of Care Patient           Patient will benefit from skilled therapeutic intervention in order to improve the following deficits and impairments:  Pain,Impaired flexibility,Decreased strength,Impaired UE functional use  Visit Diagnosis: No diagnosis found.     Problem List Patient Active Problem List   Diagnosis Date Noted   Migraine 04/18/2016   Family history of malignant neoplasm of gastrointestinal tract 09/25/2013   Colon cancer ([place]) 01/21/2013   Mass of colon 01/09/2013   Hx of migraine headaches 01/09/2013   Restless leg syndrome 01/09/2013   History of nephrolithiasis 01/09/2013    Jeremiah Sanford 06/02/2020,  10:45 AM  [facility] Physical Therapy [address] [place], [place], [address] Phone: [phone]   Fax:  [phone]  Name: Jeremiah Sanford MRN: [id number] Date of Birth: [birth year]

## 2020-06-05 ENCOUNTER — Ambulatory Visit: Payer: BC Managed Care – PPO | Admitting: Physical Therapy

## 2020-06-05 ENCOUNTER — Encounter: Payer: Self-pay | Admitting: Physical Therapy

## 2020-06-05 ENCOUNTER — Other Ambulatory Visit: Payer: Self-pay

## 2020-06-05 DIAGNOSIS — M6281 Muscle weakness (generalized): Secondary | ICD-10-CM | POA: Diagnosis not present

## 2020-06-05 DIAGNOSIS — M25622 Stiffness of left elbow, not elsewhere classified: Secondary | ICD-10-CM

## 2020-06-05 DIAGNOSIS — M25522 Pain in left elbow: Secondary | ICD-10-CM | POA: Diagnosis not present

## 2020-06-05 DIAGNOSIS — R600 Localized edema: Secondary | ICD-10-CM

## 2020-06-05 NOTE — Therapy (Signed)
[facility] Physical Therapy [address] [place], [place], [address] Phone: [phone]   Fax:  [phone]  Physical Therapy Treatment  Patient Details  Name: Jeremiah Sanford MRN: [id number] Date of Birth: [birth year] Referring Provider (PT): Jean Rosenthal MD   Encounter Date: 06/05/2020   PT End of Session - 06/05/20 1112    Visit Number 6    Number of Visits 9    Date for PT Re-Evaluation 06/29/20    PT Start Time 1010    PT Stop Time 1050    PT Time Calculation (min) 40 min    Activity Tolerance Patient tolerated treatment well    Behavior During Therapy [facility] for tasks assessed/performed           Past Medical History:  Diagnosis Date  . Anemia   . Cancer ([place])   . Colon cancer ([place])   . Headache   . History of nephrolithiasis 01/09/2013  . Hx of migraine headaches 01/09/2013  . Mass of colon 01/09/2013  . Restless leg syndrome 01/09/2013   On Medication for this    Past Surgical History:  Procedure Laterality Date  . LAPAROSCOPIC PARTIAL COLECTOMY N/A 01/02/2013   Procedure: LAPAROSCOPIC PROXIMAL COLECTOMY;  Surgeon: Merrie Roof, MD;  Location: [place];  Service: General;  Laterality: N/A;  . PARTIAL COLECTOMY Right 01/02/2013   Procedure: PARTIAL COLECTOMY;  Surgeon: Merrie Roof, MD;  Location: [place];  Service: General;  Laterality: Right;    There were no vitals filed for this visit.   Subjective Assessment - 06/05/20 1010    Subjective has been taking advil more and feels it is much better; he doesn't want to take advil long term though    Pertinent History colon CA, migraines, nephrolithiasis, anemia    Limitations Lifting;House hold activities    Patient Stated Goals Be abe to use my arm without pain    Currently in Pain? Yes    Pain Score 4     Pain Location Elbow    Pain Orientation Left    Pain Descriptors / Indicators Aching    Pain Type Chronic pain    Pain Onset More than a month ago    Pain Frequency  Intermittent    Aggravating Factors  certain movements, sleeping    Pain Relieving Factors brace, ice, advil                             [facility] Adult PT Treatment/Exercise - 06/05/20 1014      Exercises   Exercises Wrist;Elbow      Elbow Exercises   Elbow Flexion Left;20 reps;Seated;Bar weights/barbell    Bar Weights/Barbell (Elbow Flexion) 1 lb    Elbow Flexion Limitations to 90 deg    Forearm Supination Left;20 reps;Seated;Bar weights/barbell    Bar Weights/Barbell (Forearm Supination) 1 lb    Forearm Pronation Left;20 reps;Seated;Bar weights/barbell    Bar Weights/Barbell (Forearm Pronation) 1 lb    Other elbow exercises stretching for wrist extensors and wrist flexors 30 sec X 3 ea      Wrist Exercises   Wrist Flexion Left;20 reps;Seated;Bar weights/barbell    Bar Weights/Barbell (Wrist Flexion) 1 lb    Wrist Extension Left;20 reps;Bar weights/barbell;Seated    Bar Weights/Barbell (Wrist Extension) 1 lb    Wrist Radial Deviation Left;20 reps;Seated;Theraband    Bar Weights/Barbell (Radial Deviation) 1 lb      Manual Therapy   Manual therapy comments  IASTM to left elbow, triceps, wrist extensors.Leuko tape around Lt forearm to simulate tennis elbow strap.                    PT Short Term Goals - 06/05/20 1112      PT SHORT TERM GOAL #1   Title Pt will be independent in his intial HEP    Time 3    Period Weeks    Status Achieved    Target Date 06/08/20      PT SHORT TERM GOAL #2   Title Pt will be able to report pain </= 5/10 with ADL's.    Baseline met with NSAIDS, still with elevated pain without medication    Time 3    Period Weeks    Status Partially Met             PT Long Term Goals - 06/05/20 1113      PT LONG TERM GOAL #1   Title pt will improve his FOTO from 41% limitation to </= 31% limitation.    Time 6    Period Weeks    Status On-going    Target Date 06/30/19      PT LONG TERM GOAL #2   Title Pt will be able to  lift 10# from counter height and place on shelf over head with pain</= 2/10 using L UE.    Time 6    Period Weeks    Status On-going    Target Date 06/30/19      PT LONG TERM GOAL #3   Title Pt will improve his R elbow flexion/extension strength to 5/5.    Time 6    Period Weeks    Status On-going    Target Date 06/30/19                 Plan - 06/05/20 1113    Clinical Impression Statement Pt arrived today reporting improved symptoms overall and considerable decrease in swelling.  He does mention taking more advil recently and feels this is helping with inflammation, but not sustainable long term.  Tolerated exercises well today with focus on active strengthening and will continue to benefit from PT to maximize function.  He sees MD next week, and will determine next steps following.    Personal Factors and Comorbidities Comorbidity 3+    Comorbidities colon CA, HA, migraines, nephrolithiasis, anemia    Examination-Activity Limitations Lift;Carry    Examination-Participation Restrictions Yard Work;Other    Stability/Clinical Decision Making Stable/Uncomplicated    Rehab Potential Excellent    PT Frequency Other (comment)   2x/ week progressing to 1 x/ week   PT Duration 6 weeks    PT Treatment/Interventions Iontophoresis 107m/ml Dexamethasone;Cryotherapy;Electrical Stimulation;Functional mobility training;Therapeutic activities;Therapeutic exercise;Neuromuscular re-education;Patient/family education;Manual techniques;Passive range of motion;Dry needling;Taping    PT Next Visit Plan to MD next week, do FOTO and look at goals, see how he's doing/still taking advil?    PT Home Exercise Plan [email or web address]    Consulted and Agree with Plan of Care Patient           Patient will benefit from skilled therapeutic intervention in order to improve the following deficits and impairments:  Pain,Impaired flexibility,Decreased strength,Impaired UE functional use  Visit Diagnosis: Pain in left  elbow  Stiffness of left elbow, not elsewhere classified  Muscle weakness (generalized)  Localized edema     Problem List Patient Active Problem List   Diagnosis Date Noted  . Migraine 04/18/2016  . Family  history of malignant neoplasm of gastrointestinal tract 09/25/2013  . Colon cancer ([place]) 01/21/2013  . Mass of colon 01/09/2013  . Hx of migraine headaches 01/09/2013  . Restless leg syndrome 01/09/2013  . History of nephrolithiasis 01/09/2013      Laureen Abrahams, PT, DPT 06/05/20 11:17 AM    [facility] Physical Therapy [address] [place], [place], [address] Phone: [phone]   Fax:  [phone]  Name: Jeremiah Sanford MRN: [id number] Date of Birth: 11-06-1965

## 2020-06-08 ENCOUNTER — Encounter: Payer: BC Managed Care – PPO | Admitting: Physical Therapy

## 2020-06-08 ENCOUNTER — Telehealth: Payer: Self-pay | Admitting: Physical Therapy

## 2020-06-08 NOTE — Telephone Encounter (Signed)
I called pt to follow up about missing his 11:00am physical therapy appointment today. There was no answer and I was unable to leave a message.   Kearney Hard, PT, MPT 06/08/20 11:27 AM

## 2020-06-10 ENCOUNTER — Ambulatory Visit: Payer: BC Managed Care – PPO | Admitting: Orthopaedic Surgery

## 2020-06-10 ENCOUNTER — Encounter: Payer: Self-pay | Admitting: Physical Therapy

## 2020-06-10 ENCOUNTER — Other Ambulatory Visit: Payer: Self-pay

## 2020-06-10 ENCOUNTER — Ambulatory Visit: Payer: BC Managed Care – PPO | Admitting: Physical Therapy

## 2020-06-10 ENCOUNTER — Other Ambulatory Visit: Payer: Self-pay | Admitting: Orthopaedic Surgery

## 2020-06-10 ENCOUNTER — Telehealth: Payer: Self-pay

## 2020-06-10 DIAGNOSIS — R600 Localized edema: Secondary | ICD-10-CM | POA: Diagnosis not present

## 2020-06-10 DIAGNOSIS — M6281 Muscle weakness (generalized): Secondary | ICD-10-CM

## 2020-06-10 DIAGNOSIS — M25622 Stiffness of left elbow, not elsewhere classified: Secondary | ICD-10-CM | POA: Diagnosis not present

## 2020-06-10 DIAGNOSIS — M25522 Pain in left elbow: Secondary | ICD-10-CM | POA: Diagnosis not present

## 2020-06-10 MED ORDER — METHYLPREDNISOLONE 4 MG PO TABS: ORAL_TABLET | ORAL | 0 refills | Status: DC

## 2020-06-10 NOTE — Therapy (Addendum)
[facility] Physical Therapy [address] [place], [place], [address] Phone: [phone]   Fax:  [phone]  Physical Therapy Treatment Discharge Summary  Patient Details  Name: Jeremiah Sanford MRN: [id number] Date of Birth: 26-Aug-1965 Referring Provider (PT): Jean Rosenthal MD   Encounter Date: 06/10/2020   PT End of Session - 06/10/20 1112    Visit Number 7    Number of Visits 9    Date for PT Re-Evaluation 06/29/20    PT Start Time 0930    PT Stop Time 1008    PT Time Calculation (min) 38 min    Activity Tolerance Patient tolerated treatment well    Behavior During Therapy [facility] for tasks assessed/performed           Past Medical History:  Diagnosis Date  . Anemia   . Cancer ([place])   . Colon cancer ([place])   . Headache   . History of nephrolithiasis 01/09/2013  . Hx of migraine headaches 01/09/2013  . Mass of colon 01/09/2013  . Restless leg syndrome 01/09/2013   On Medication for this    Past Surgical History:  Procedure Laterality Date  . LAPAROSCOPIC PARTIAL COLECTOMY N/A 01/02/2013   Procedure: LAPAROSCOPIC PROXIMAL COLECTOMY;  Surgeon: Merrie Roof, MD;  Location: [place];  Service: General;  Laterality: N/A;  . PARTIAL COLECTOMY Right 01/02/2013   Procedure: PARTIAL COLECTOMY;  Surgeon: Merrie Roof, MD;  Location: [place];  Service: General;  Laterality: Right;    There were no vitals filed for this visit.   Subjective Assessment - 06/10/20 0935    Subjective Pt arriving to therapy reporting MD called in new prescription for predisone.    Pertinent History colon CA, migraines, nephrolithiasis, anemia    Limitations Lifting;House hold activities    Patient Stated Goals Be abe to use my arm without pain    Currently in Pain? Yes    Pain Score 2     Pain Orientation Left    Pain Descriptors / Indicators Aching;Sore    Pain Type Chronic pain    Pain Onset More than a month ago                              [facility] Adult PT Treatment/Exercise - 06/10/20 0001      Elbow Exercises   Other elbow exercises L shoulder extension with elbow extension x 3 holding 30 seconds    Other elbow exercises stretching for wrist extensors and wrist flexors 30 sec  x5 ea      Wrist Exercises   Other wrist exercises ulnar/radial deviation with no weights      Manual Therapy   Manual therapy comments IASTM to Left elbow, ice massage to L elbow and forearm, PROM: elbow flexion and extension, pronation/supination                    PT Short Term Goals - 06/10/20 1123      PT SHORT TERM GOAL #1   Title Pt will be independent in his intial HEP    Time 3    Period Weeks    Status Achieved      PT SHORT TERM GOAL #2   Title Pt will be able to report pain </= 5/10 with ADL's.    Baseline met with NSAIDS, still with elevated pain without medication    Status Partially Met  PT Long Term Goals - 06/05/20 1113      PT LONG TERM GOAL #1   Title pt will improve his FOTO from 41% limitation to </= 31% limitation.    Time 6    Period Weeks    Status On-going    Target Date 06/30/19      PT LONG TERM GOAL #2   Title Pt will be able to lift 10# from counter height and place on shelf over head with pain</= 2/10 using L UE.    Time 6    Period Weeks    Status On-going    Target Date 06/30/19      PT LONG TERM GOAL #3   Title Pt will improve his R elbow flexion/extension strength to 5/5.    Time 6    Period Weeks    Status On-going    Target Date 06/30/19                 Plan - 06/10/20 1013    Clinical Impression Statement Pt stating his MD prescribed prednisone for inflamation. Pt feels resting and stretching seems to help the best. Continue to progress as pt tolerates.    Personal Factors and Comorbidities Comorbidity 3+    Comorbidities colon CA, HA, migraines, nephrolithiasis, anemia    Examination-Activity Limitations Lift;Carry    Examination-Participation  Restrictions Yard Work;Other    Stability/Clinical Decision Making Stable/Uncomplicated    Rehab Potential Excellent    PT Frequency Other (comment)    PT Treatment/Interventions Iontophoresis 4mg/ml Dexamethasone;Cryotherapy;Electrical Stimulation;Functional mobility training;Therapeutic activities;Therapeutic exercise;Neuromuscular re-education;Patient/family education;Manual techniques;Passive range of motion;Dry needling;Taping    PT Next Visit Plan Monitor improvements after prednisone prescription and monitor goals.    PT Home Exercise Plan [email or web address]    Consulted and Agree with Plan of Care Patient           Patient will benefit from skilled therapeutic intervention in order to improve the following deficits and impairments:  Pain,Impaired flexibility,Decreased strength,Impaired UE functional use  Visit Diagnosis: Pain in left elbow  Stiffness of left elbow, not elsewhere classified  Muscle weakness (generalized)  Localized edema  PHYSICAL THERAPY DISCHARGE SUMMARY  Visits from Start of Care: 7  Current functional level related to goals / functional outcomes: see above    Remaining deficits: see above     Education / Equipment: HEP  Plan: Patient agrees to discharge.  Patient goals were not met. Patient is being discharged due to not returning since the last visit.  ?????        Problem List Patient Active Problem List   Diagnosis Date Noted  . Migraine 04/18/2016  . Family history of malignant neoplasm of gastrointestinal tract 09/25/2013  . Colon cancer ([facility]) 01/21/2013  . Mass of colon 01/09/2013  . Hx of migraine headaches 01/09/2013  . Restless leg syndrome 01/09/2013  . History of nephrolithiasis 01/09/2013    Jennifer R Martin PT, MPT 06/10/2020, 11:24 AM Jennifer Martin, PT, MPT 08/03/20 1:36 PM    [facility] Physical Therapy [address] [place], [place], [address] Phone: [phone]   Fax:  [phone]  Name:  Jeremiah Sanford MRN: [id number] Date of Birth: 08/08/1965   

## 2020-06-10 NOTE — Telephone Encounter (Signed)
I will send it in. 

## 2020-06-10 NOTE — Telephone Encounter (Signed)
Patient called he had a appointment this morning at 8:30 he was unable to make it he stated PT told him to schedule a appt. So he can get prednisone prescribed due to swelling not going down. CB:[phone]

## 2020-06-23 ENCOUNTER — Other Ambulatory Visit: Payer: Self-pay

## 2020-06-23 ENCOUNTER — Encounter: Payer: Self-pay | Admitting: Orthopaedic Surgery

## 2020-06-23 ENCOUNTER — Ambulatory Visit (INDEPENDENT_AMBULATORY_CARE_PROVIDER_SITE_OTHER): Payer: BC Managed Care – PPO | Admitting: Orthopaedic Surgery

## 2020-06-23 DIAGNOSIS — M7712 Lateral epicondylitis, left elbow: Secondary | ICD-10-CM

## 2020-06-23 DIAGNOSIS — M25522 Pain in left elbow: Secondary | ICD-10-CM

## 2020-06-23 NOTE — Progress Notes (Signed)
The patient continues to deal with severe left elbow lateral epicondylitis.  He has been dealing with this for well over a year and we have tried conservative treatment now for well over a year.  He has had multiple steroid injections as well as activity modification.  He is work on how he lifts things.  He is taken anti-inflammatories and modified his work duties.  He is also been through formal physical therapy.  His pain is still quite severe on that side.  He did have a history of right elbow lateral epicondylitis that eventually resolved.  Examination of his left elbow does show swelling and pain along the lateral epicondyle area of the elbow.  It hurts with definitive testing for lateral epicondylitis.  This point a MRI is warranted given the failed conservative treatment over a year now which includes anti-inflammatories, rest, activity modification, steroid injections and formal physical therapy.  A MRI would be helpful to look for pathology in this area so then we can come up with a treatment and likely surgical planning.  We will see him back after follow-up from the MRI.

## 2020-07-15 ENCOUNTER — Other Ambulatory Visit: Payer: Self-pay

## 2020-07-15 ENCOUNTER — Ambulatory Visit
Admission: RE | Admit: 2020-07-15 | Discharge: 2020-07-15 | Disposition: A | Discharge status: Home / Self Care | Payer: BC Managed Care – PPO | Source: Ambulatory Visit | Attending: Orthopaedic Surgery | Admitting: Orthopaedic Surgery

## 2020-07-15 DIAGNOSIS — M25522 Pain in left elbow: Secondary | ICD-10-CM

## 2020-07-20 ENCOUNTER — Ambulatory Visit: Payer: BC Managed Care – PPO | Admitting: Orthopaedic Surgery

## 2020-07-20 ENCOUNTER — Encounter: Payer: Self-pay | Admitting: Orthopaedic Surgery

## 2020-07-20 DIAGNOSIS — M7712 Lateral epicondylitis, left elbow: Secondary | ICD-10-CM | POA: Diagnosis not present

## 2020-07-20 NOTE — Progress Notes (Signed)
Comes in today to go over MRI of his left elbow. 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 He has tried multiple injections as well as physical therapy and activity modification. Has been on anti-inflammatories as well. His pain is daily and is detriment affecting his activities daily living and his work.  MRI is reviewed with him of his left elbow. There is significant inflammation of the common extensor tendon at the elbow lateral epicondyle area. There is noted tear.  This point we are recommending a lateral epicondylar release with debridement of the extensor carpi radialis brevis tendon area. I explained in detail what this type of surgery involves. This can be done as an outpatient. He would then need to avoid heavy lifting for 4 to 6 weeks and modification of how he lifts things from a pronated position. I explained the risk and benefits of the surgery as well. Given the failure of conservative treatment and his continued pain he does wish to proceed with this in the near future. We will work on getting this scheduled. All questions and concerns were answered and addressed.

## 2020-07-23 ENCOUNTER — Other Ambulatory Visit: Payer: Self-pay | Admitting: Orthopaedic Surgery

## 2020-07-23 DIAGNOSIS — M7712 Lateral epicondylitis, left elbow: Secondary | ICD-10-CM | POA: Diagnosis not present

## 2020-07-23 MED ORDER — HYDROCODONE-ACETAMINOPHEN 5-325 MG PO TABS
1.0000 | ORAL_TABLET | Freq: Four times a day (QID) | ORAL | 0 refills | Status: DC | PRN
Start: 2020-07-23 — End: 2020-09-03

## 2020-08-06 ENCOUNTER — Encounter: Payer: Self-pay | Admitting: Physician Assistant

## 2020-08-06 ENCOUNTER — Ambulatory Visit (INDEPENDENT_AMBULATORY_CARE_PROVIDER_SITE_OTHER): Payer: BC Managed Care – PPO | Admitting: Physician Assistant

## 2020-08-06 DIAGNOSIS — M7712 Lateral epicondylitis, left elbow: Secondary | ICD-10-CM

## 2020-08-06 NOTE — Progress Notes (Signed)
HPI: Jeremiah Sanford returns today status post left elbow epicondyle release with debridement of extensor carpi radialis brevis tendon.  He is overall still having pain and tenderness.  He is taking ibuprofen.  He does continue to ice the area.  Physical exam: Left elbow surgical incisions well approximated with interrupted nylon sutures no signs of infection.  No dehiscence.  There is no drainage.  He has good range of motion of the elbow.  Full range of motion of the hand.  Impression: Status post left elbow epicondylar release with debridement of extensor carpi radialis brevis tendon 07/23/2020  Plan: Sutures and Steri-Strips applied.  He will work on scar tissue mobilization.  No heavy lifting with the arm.  He is to lift with a supinated palm only with light weight.  Encouraged him to get some cardiovascular exercise and such as walking 15 to 20 minutes 2-3 times daily.  We will see him back in 1 month to see how he is doing.  Questions were encouraged and answered at length.

## 2020-09-03 ENCOUNTER — Encounter: Payer: Self-pay | Admitting: Physician Assistant

## 2020-09-03 ENCOUNTER — Other Ambulatory Visit: Payer: Self-pay

## 2020-09-03 ENCOUNTER — Ambulatory Visit (INDEPENDENT_AMBULATORY_CARE_PROVIDER_SITE_OTHER): Payer: BC Managed Care – PPO | Admitting: Physician Assistant

## 2020-09-03 DIAGNOSIS — M25522 Pain in left elbow: Secondary | ICD-10-CM

## 2020-09-03 DIAGNOSIS — M7712 Lateral epicondylitis, left elbow: Secondary | ICD-10-CM

## 2020-09-03 MED ORDER — NABUMETONE 500 MG PO TABS: 500.0000 mg | ORAL_TABLET | Freq: Two times a day (BID) | ORAL | 0 refills | Status: DC | PRN

## 2020-09-03 NOTE — Progress Notes (Signed)
HPI: [name] returns today 6 weeks status post left elbow epicondylar release with debridement of extensor carpi radialis brevis tendon.  He states overall he is somewhat better he does not have the pain constantly but has pain and stiffness in the elbow.  He also notes some numbness about the incision site.  Notes only numbness to touch.  He has been getting some cardiovascular exercise.  Taking occasional over-the-counter NSAID.  Physical exam: Left elbow he has full extension full flexion full supination pronation forearm.  Surgical incisions well-healed no signs of infection.  He has tenderness over the lateral epicondyle region.  No abnormal warmth.  Positive for slight edema.  Impression: Status post left elbow epicondylar release with debridement of extensor carpi radialis brevis tendon.  Plan: We will send him to formal physical therapy to work on range of motion modalities desensitizing the area and strengthening.  Again cautioned him on any heavy lifting.  Placed him back on his Relafen.  No other NSAIDs while on the Relafen.  He can also use Voltaren gel 2 g up to 3 times daily over the elbows.  We will see him back in 1 month to see what type of progress he is made.

## 2020-09-17 ENCOUNTER — Ambulatory Visit (INDEPENDENT_AMBULATORY_CARE_PROVIDER_SITE_OTHER): Payer: BC Managed Care – PPO | Admitting: Rehabilitative and Restorative Service Providers"

## 2020-09-17 ENCOUNTER — Other Ambulatory Visit: Payer: Self-pay

## 2020-09-17 ENCOUNTER — Encounter: Payer: Self-pay | Admitting: Rehabilitative and Restorative Service Providers"

## 2020-09-17 DIAGNOSIS — M25522 Pain in left elbow: Secondary | ICD-10-CM | POA: Diagnosis not present

## 2020-09-17 DIAGNOSIS — M6281 Muscle weakness (generalized): Secondary | ICD-10-CM

## 2020-09-17 NOTE — Patient Instructions (Signed)
Access Code: [email or web address] URL: [email or web address] Date: 09/17/2020 Prepared by: Scot Jun  Exercises Standing Wrist Flexion Stretch - 2-3 x daily - 7 x weekly - 1 sets - 5 reps - 30 hold Supine Elbow Extension Stretch in Supination - 2-3 x daily - 7 x weekly - 1 sets - 5 reps - 30-60 seconds hold Forearm Pronation and Supination with Hammer - 2 x daily - 7 x weekly - 3 sets - 15 reps

## 2020-09-17 NOTE — Therapy (Signed)
[facility] [address] [place], [place], [address] Phone: [phone]   Fax:  [phone]  Physical Therapy Evaluation  Patient Details  Name: Jeremiah Sanford MRN: [id number] Date of Birth: 07/05/1965 Referring Provider (PT): Erskine Emery PA-C   Encounter Date: 09/17/2020   PT End of Session - 09/17/20 0847    Visit Number 1    Number of Visits 12    Date for PT Re-Evaluation 11/26/20    Progress Note Due on Visit 10    PT Start Time 0850    PT Stop Time 0922    PT Time Calculation (min) 32 min    Activity Tolerance Patient tolerated treatment well    Behavior During Therapy  [facility] for tasks assessed/performed           Past Medical History:  Diagnosis Date  . Anemia   . Cancer ([place])   . Colon cancer ([place])   . Headache   . History of nephrolithiasis 01/09/2013  . Hx of migraine headaches 01/09/2013  . Mass of colon 01/09/2013  . Restless leg syndrome 01/09/2013   On Medication for this    Past Surgical History:  Procedure Laterality Date  . LAPAROSCOPIC PARTIAL COLECTOMY N/A 01/02/2013   Procedure: LAPAROSCOPIC PROXIMAL COLECTOMY;  Surgeon: Merrie Roof, MD;  Location: [place];  Service: General;  Laterality: N/A;  . PARTIAL COLECTOMY Right 01/02/2013   Procedure: PARTIAL COLECTOMY;  Surgeon: Merrie Roof, MD;  Location: [place];  Service: General;  Laterality: Right;    There were no vitals filed for this visit.    Subjective Assessment - 09/17/20 0846    Subjective Jeremiah Sanford 8 weeks status post left elbow epicondylar release with debridement of extensor carpi radialis brevis tendon on Jul 23, 2020.  Pt. stated moderate use creates simliar pain as before surgery was performed.  Difficulty c bending elbow.    Limitations Lifting;House hold activities    Patient Stated Goals Reduce pain    Currently in Pain? Yes    Pain Score 0-No pain   pain at worst 6/10   Pain Location Elbow    Pain Orientation Left   lateral epicondyle  region   Pain Descriptors / Indicators Shooting;Aching    Pain Type Surgical pain    Pain Onset More than a month ago    Pain Frequency Intermittent    Aggravating Factors  bending elbow, hitting elbow on something,  increased use/repetitive activity.    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  No    Is the patient reluctant to leave their home because of a fear of falling?  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.)    Leisure Car work, Art therapist      Observation/Other Assessments   Observations Localized edema noted around incision    Focus on Therapeutic Outcomes (FOTO)  intake 67%, expected outcome 73%      ROM / Strength   AROM / PROM / Strength AROM;PROM;Strength      AROM   Overall AROM Comments Lt lateral elbow pain noted c end range flexion/extension elbow, end range Lt wrist extension, supination    AROM Assessment Site Elbow;Forearm;Wrist    Right/Left Elbow Left;Right    Left Elbow Flexion 135    Left Elbow Extension -5    Right/Left Forearm Left;Right    Left Forearm Pronation 90 Degrees    Left Forearm Supination 80 Degrees    Right/Left Wrist Right    Left Wrist Extension 65 Degrees     Left Wrist Flexion 70 Degrees      PROM   PROM Assessment Site Elbow;Forearm    Right/Left Elbow Left    Left Elbow Flexion 140    Left Elbow Extension 0      Strength   Strength Assessment Site Elbow;Forearm;Wrist;Hand    Right/Left Elbow Left;Right    Right Elbow Flexion 5/5    Right Elbow Extension 5/5    Left Elbow Flexion 5/5    Left Elbow Extension 5/5    Right/Left Forearm Right;Left    Left Forearm Pronation 5/5    Left Forearm Supination 4/5   pain   Right/Left Wrist Left;Right    Right Wrist Flexion 5/5    Right Wrist Extension 4+/5   mild discomfort   Right/Left hand Left;Right    Right Hand Grip (lbs) 91, 97.4    Left Hand Grip (lbs) 83.6, 92.3   painfree     Palpation   Palpation comment Tenderness noted lateral epicondyle, Trigger point noted in Lt extensor group c localized symptoms                      Objective measurements completed on examination: See above findings.       [facility] Adult PT Treatment/Exercise - 09/17/20 0001      Exercises   Exercises Other Exercises;Wrist;Elbow    Other Exercises  HEP instruction/performance c cues for techniques, handout provided.  Trial set performed of each for comprehension and symptom assessment.                  PT Education - 09/17/20 0847    Education Details HEP, POC    Person(s) Educated Patient    Methods Explanation;Demonstration;Verbal cues;Handout    Comprehension Verbalized understanding;Returned demonstration            PT Short Term Goals - 09/17/20 0847      PT SHORT TERM GOAL #1   Title Patient will demonstrate independent use of home exercise program to maintain progress from in clinic treatments.    Time 3    Period Weeks    Status New    Target Date 10/08/20             PT Long Term Goals - 09/17/20 0848      PT LONG TERM GOAL #1   Title Patient will demonstrate/report pain at worst less than or equal to 2/10 to facilitate minimal limitation  in daily  activity secondary to pain symptoms.    Time 10    Period Weeks    Status New    Target Date 11/26/20      PT LONG TERM GOAL #2   Title Patient will demonstrate independent use of home exercise program to facilitate ability to maintain/progress functional gains from skilled physical therapy services.    Time 10    Period Weeks    Status New    Target Date 11/26/20      PT LONG TERM GOAL #3   Title Pt. will demonstrate FOTO outcome > or = 73% to indicated reduced disability due to condition    Time 10    Period Weeks    Status New    Target Date 11/26/20      PT LONG TERM GOAL #4   Title Pt. will demonstrate Lt wrist/elbow AROM WFL s symptoms for usual mobility of daily activity.    Time 10    Period Weeks    Status New    Target Date 11/26/20      PT LONG TERM GOAL #5   Title Pt. will demonstrate Lt UE MMT 5/5 throughout to facilitate usual daily lifting, carrying, push/pull at PLOF.    Time 10    Period Weeks    Status New    Target Date 11/26/20                  Plan - 09/17/20 0848    Clinical Impression Statement Patient is a 55 y.o. who comes to clinic with complaints of Lt elbow pain s/p recent release/debridement surgery with mobility, strength and movement coordination deficits that impair their ability to perform usual daily and recreational functional activities without increase difficulty/symptoms at this time.  Patient to benefit from skilled PT services to address impairments and limitations to improve to previous level of function without restriction secondary to condition.    Examination-Activity Limitations Sleep;Carry;Lift;Reach Overhead    Examination-Participation Restrictions Community Activity;Yard Work;Other   hobbies, car work   Merchant navy officer Stable/Uncomplicated    Designer, jewellery Low    Rehab Potential Good    PT Frequency --   1-2x/week   PT Duration Other (comment)   10wk   PT Treatment/Interventions  ADLs/Self Care Home Management;Cryotherapy;Electrical Stimulation;Moist Heat;Iontophoresis 4mg /ml Dexamethasone;Therapeutic exercise;Therapeutic activities;Functional mobility training;DME Instruction;Ultrasound;Neuromuscular re-education;Passive range of motion;Joint Manipulations;Dry needling;Vasopneumatic Device;Taping;Manual techniques    PT Next Visit Plan Review HEP, progress to light endurance based strengthening/loading.  Possible dry needling for extensor group at some point    PT Home Exercise Plan [email or web address]    Consulted and Agree with Plan of Care Patient           Patient will benefit from skilled therapeutic intervention in order to improve the following deficits and impairments:  Decreased endurance,Pain,Increased edema,Decreased activity tolerance,Decreased strength,Increased fascial restricitons,Impaired UE functional use,Decreased mobility,Decreased range of motion,Impaired perceived functional ability,Impaired flexibility  Visit Diagnosis: Pain in left elbow  Muscle weakness (generalized)     Problem List Patient Active Problem List   Diagnosis Date Noted  . Migraine 04/18/2016  . Family history of malignant neoplasm of gastrointestinal tract 09/25/2013  . Colon cancer ([place]) 01/21/2013  . Mass of colon 01/09/2013  . Hx of migraine headaches 01/09/2013  . Restless leg syndrome 01/09/2013  . History of nephrolithiasis 01/09/2013    Scot Jun, PT, DPT, OCS, ATC 09/17/20  9:47 AM    [facility] [place],  [place], [address] Phone: [phone]   Fax:  [phone]  Name: Jeremiah Sanford MRN: [id number] Date of Birth: 23-Nov-1965

## 2020-09-24 ENCOUNTER — Ambulatory Visit: Payer: BC Managed Care – PPO | Admitting: Physical Therapy

## 2020-09-24 ENCOUNTER — Other Ambulatory Visit: Payer: Self-pay

## 2020-09-24 ENCOUNTER — Encounter: Payer: Self-pay | Admitting: Physical Therapy

## 2020-09-24 DIAGNOSIS — M6281 Muscle weakness (generalized): Secondary | ICD-10-CM | POA: Diagnosis not present

## 2020-09-24 DIAGNOSIS — R600 Localized edema: Secondary | ICD-10-CM

## 2020-09-24 DIAGNOSIS — M25522 Pain in left elbow: Secondary | ICD-10-CM

## 2020-09-24 DIAGNOSIS — M25622 Stiffness of left elbow, not elsewhere classified: Secondary | ICD-10-CM

## 2020-09-24 NOTE — Therapy (Signed)
[facility] [address] [place], [place], [address] Phone: [phone]   Fax:  [phone]  Physical Therapy Treatment  Patient Details  Name: Jeremiah Sanford MRN: [id number] Date of Birth: [birth year] Referring Provider (PT): Erskine Emery PA-C   Encounter Date: 09/24/2020   PT End of Session - 09/24/20 1507    Visit Number 2    Number of Visits 12    Date for PT Re-Evaluation 11/26/20    Progress Note Due on Visit 10    PT Start Time 2376    PT Stop Time 1511    PT Time Calculation (min) 38 min    Activity Tolerance Patient tolerated treatment well    Behavior During Therapy [facility] for tasks assessed/performed           Past Medical History:  Diagnosis Date  . Anemia   . Cancer ([place])   . Colon cancer ([place])   . Headache   . History of nephrolithiasis 01/09/2013  . Hx of migraine headaches 01/09/2013  . Mass of colon 01/09/2013  . Restless leg syndrome 01/09/2013   On Medication for this    Past Surgical History:  Procedure Laterality Date  . LAPAROSCOPIC PARTIAL COLECTOMY N/A 01/02/2013   Procedure: LAPAROSCOPIC PROXIMAL COLECTOMY;  Surgeon: Merrie Roof, MD;  Location: [place];  Service: General;  Laterality: N/A;  . PARTIAL COLECTOMY Right 01/02/2013   Procedure: PARTIAL COLECTOMY;  Surgeon: Merrie Roof, MD;  Location: [place];  Service: General;  Laterality: Right;    There were no vitals filed for this visit.   Subjective Assessment - 09/24/20 1436    Subjective arm is feeling better; feels like he's able to do more with the arm    Limitations Lifting;House hold activities    Patient Stated Goals Reduce pain    Currently in Pain? No/denies                             [facility] Adult PT Treatment/Exercise - 09/24/20 1437      Exercises   Exercises Other Exercises    Other Exercises  UBE L3.0 x 6 min (3' each way, 45 sec slow, 15 sec push)      Elbow Exercises   Elbow Flexion Limitations 3x10 each on  Lt; 3#, alternating hammer curl/bicep curl    Elbow Extension Limitations bent over tricep kickback 3# on Lt 3x10    Forearm Supination Left;15 reps;Seated;Bar weights/barbell   3 sets   Forearm Pronation Left;15 reps;Seated;Bar weights/barbell   3 sets   Other elbow exercises tricep stretch 3x30 sec      Wrist Exercises   Wrist Flexion Strengthening;Left;Seated;Bar weights/barbell   3x10   Bar Weights/Barbell (Wrist Flexion) 3 lbs    Wrist Extension Strengthening;Left;Seated;Bar weights/barbell   3x10   Bar Weights/Barbell (Wrist Extension) 3 lbs    Wrist Radial Deviation Left;Seated;Bar weights/barbell   3x10   Bar Weights/Barbell (Radial Deviation) 3 lbs    Other wrist exercises wrist extensors stretch 3x30 sec; Lt                  PT Education - 09/24/20 1506    Education Details HEP    Person(s) Educated Patient    Methods Explanation;Demonstration;Handout    Comprehension Verbalized understanding;Returned demonstration;Need further instruction            PT Short Term Goals - 09/24/20 1507      PT SHORT  TERM GOAL #1   Title Patient will demonstrate independent use of home exercise program to maintain progress from in clinic treatments.    Time 3    Period Weeks    Status On-going    Target Date 10/08/20      PT SHORT TERM GOAL #2   Title n/a             PT Long Term Goals - 09/17/20 0848      PT LONG TERM GOAL #1   Title Patient will demonstrate/report pain at worst less than or equal to 2/10 to facilitate minimal limitation in daily activity secondary to pain symptoms.    Time 10    Period Weeks    Status New    Target Date 11/26/20      PT LONG TERM GOAL #2   Title Patient will demonstrate independent use of home exercise program to facilitate ability to maintain/progress functional gains from skilled physical therapy services.    Time 10    Period Weeks    Status New    Target Date 11/26/20      PT LONG TERM GOAL #3   Title Pt. will  demonstrate FOTO outcome > or = 73% to indicated reduced disability due to condition    Time 10    Period Weeks    Status New    Target Date 11/26/20      PT LONG TERM GOAL #4   Title Pt. will demonstrate Lt wrist/elbow AROM WFL s symptoms for usual mobility of daily activity.    Time 10    Period Weeks    Status New    Target Date 11/26/20      PT LONG TERM GOAL #5   Title Pt. will demonstrate Lt UE MMT 5/5 throughout to facilitate usual daily lifting, carrying, push/pull at PLOF.    Time 10    Period Weeks    Status New    Target Date 11/26/20                 Plan - 09/24/20 1507    Clinical Impression Statement Pt compliant with HEP today and added tricep stretch to current program.  Overall progressing well working on strengthening as stretching as able.  Will continue to benefit from PT to [facility] function.    Examination-Activity Limitations Sleep;Carry;Lift;Reach Overhead    Examination-Participation Restrictions Community Activity;Yard Work;Other   hobbies, car work   Merchant navy officer Stable/Uncomplicated    Rehab Potential Good    PT Frequency --   1-2x/week   PT Duration Other (comment)   10wk   PT Treatment/Interventions ADLs/Self Care Home Management;Cryotherapy;Electrical Stimulation;Moist Heat;Iontophoresis 4mg /ml Dexamethasone;Therapeutic exercise;Therapeutic activities;Functional mobility training;DME Instruction;Ultrasound;Neuromuscular re-education;Passive range of motion;Joint Manipulations;Dry needling;Vasopneumatic Device;Taping;Manual techniques    PT Next Visit Plan Review new HEP, progress to light endurance based strengthening/loading.  Possible dry needling for extensor group at some point    PT Home Exercise Plan [email or web address]    Consulted and Agree with Plan of Care Patient           Patient will benefit from skilled therapeutic intervention in order to improve the following deficits and impairments:  Decreased  endurance,Pain,Increased edema,Decreased activity tolerance,Decreased strength,Increased fascial restricitons,Impaired UE functional use,Decreased mobility,Decreased range of motion,Impaired perceived functional ability,Impaired flexibility  Visit Diagnosis: Pain in left elbow  Muscle weakness (generalized)  Stiffness of left elbow, not elsewhere classified  Localized edema     Problem List Patient Active Problem List   Diagnosis  Date Noted  . Migraine 04/18/2016  . Family history of malignant neoplasm of gastrointestinal tract 09/25/2013  . Colon cancer ([place]) 01/21/2013  . Mass of colon 01/09/2013  . Hx of migraine headaches 01/09/2013  . Restless leg syndrome 01/09/2013  . History of nephrolithiasis 01/09/2013      Laureen Abrahams, PT, DPT 09/24/20 3:10 PM  [facility] [address] [place], [place], [address] Phone: [phone]   Fax:  [phone]  Name: Jeremiah Sanford MRN: [id number] Date of Birth: [birth year]

## 2020-09-24 NOTE — Patient Instructions (Signed)
Access Code: [email or web address] URL: [email or web address] Date: 09/24/2020 Prepared by: Faustino Congress  Exercises Standing Wrist Flexion Stretch - 2-3 x daily - 7 x weekly - 1 sets - 5 reps - 30 hold Supine Elbow Extension Stretch in Supination - 2-3 x daily - 7 x weekly - 1 sets - 5 reps - 30-60 seconds hold Forearm Pronation and Supination with Hammer - 2 x daily - 7 x weekly - 3 sets - 15 reps Standing Overhead Triceps Stretch - 2-3 x daily - 7 x weekly - 1 sets - 5 reps - 30 sec hold

## 2020-09-30 ENCOUNTER — Telehealth: Payer: Self-pay | Admitting: Physical Therapy

## 2020-09-30 ENCOUNTER — Encounter: Payer: BC Managed Care – PPO | Admitting: Physical Therapy

## 2020-09-30 NOTE — Telephone Encounter (Signed)
Called pt as he did not show for PT appt, was not aware of appt today and confirmed appt for 4/14.  Laureen Abrahams, PT, DPT 09/30/20 12:03 PM

## 2020-10-01 ENCOUNTER — Encounter: Payer: Self-pay | Admitting: Physician Assistant

## 2020-10-01 ENCOUNTER — Ambulatory Visit (INDEPENDENT_AMBULATORY_CARE_PROVIDER_SITE_OTHER): Payer: BC Managed Care – PPO | Admitting: Physician Assistant

## 2020-10-01 ENCOUNTER — Other Ambulatory Visit: Payer: Self-pay

## 2020-10-01 ENCOUNTER — Encounter: Payer: Self-pay | Admitting: Rehabilitative and Restorative Service Providers"

## 2020-10-01 ENCOUNTER — Ambulatory Visit: Payer: BC Managed Care – PPO | Admitting: Rehabilitative and Restorative Service Providers"

## 2020-10-01 DIAGNOSIS — R600 Localized edema: Secondary | ICD-10-CM | POA: Diagnosis not present

## 2020-10-01 DIAGNOSIS — M6281 Muscle weakness (generalized): Secondary | ICD-10-CM

## 2020-10-01 DIAGNOSIS — M25522 Pain in left elbow: Secondary | ICD-10-CM

## 2020-10-01 DIAGNOSIS — M25622 Stiffness of left elbow, not elsewhere classified: Secondary | ICD-10-CM | POA: Diagnosis not present

## 2020-10-01 DIAGNOSIS — M7712 Lateral epicondylitis, left elbow: Secondary | ICD-10-CM

## 2020-10-01 MED ORDER — NABUMETONE 500 MG PO TABS: 500.0000 mg | ORAL_TABLET | Freq: Two times a day (BID) | ORAL | 0 refills | Status: AC | PRN

## 2020-10-01 NOTE — Therapy (Signed)
[facility] Physical Therapy [address] [place], [place], [address] Phone: [phone]   Fax:  [phone]  Physical Therapy Treatment  Patient Details  Name: Jeremiah Sanford MRN: [id number] Date of Birth: September 25, 1965 Referring Provider (PT): Erskine Emery PA-C   Encounter Date: 10/01/2020   PT End of Session - 10/01/20 1424    Visit Number 3    Number of Visits 12    Date for PT Re-Evaluation 11/26/20    Progress Note Due on Visit 10    PT Start Time 0350    PT Stop Time 1425    PT Time Calculation (min) 40 min    Activity Tolerance Patient tolerated treatment well;No increased pain    Behavior During Therapy WFL for tasks assessed/performed           Past Medical History:  Diagnosis Date  . Anemia   . Cancer ([place])   . Colon cancer ([place])   . Headache   . History of nephrolithiasis 01/09/2013  . Hx of migraine headaches 01/09/2013  . Mass of colon 01/09/2013  . Restless leg syndrome 01/09/2013   On Medication for this    Past Surgical History:  Procedure Laterality Date  . LAPAROSCOPIC PARTIAL COLECTOMY N/A 01/02/2013   Procedure: LAPAROSCOPIC PROXIMAL COLECTOMY;  Surgeon: Merrie Roof, MD;  Location: [place];  Service: General;  Laterality: N/A;  . PARTIAL COLECTOMY Right 01/02/2013   Procedure: PARTIAL COLECTOMY;  Surgeon: Merrie Roof, MD;  Location: [place];  Service: General;  Laterality: Right;    There were no vitals filed for this visit.   Subjective Assessment - 10/01/20 1411    Subjective [name] notes better pain overall.  He still gets occasional jabs of pain (1-2/week).    Limitations Lifting;House hold activities    Patient Stated Goals Reduce pain    Currently in Pain? 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    Multiple Pain Sites No              [facility] PT Assessment - 10/01/20 0001      AROM   Right Elbow Flexion 145    Right Elbow Extension -10    Left Elbow Flexion 140    Left Elbow Extension -13    Right Forearm Pronation 80 Degrees    Right Forearm Supination 80 Degrees    Left Forearm Pronation 90 Degrees    Left Forearm Supination 80 Degrees                         [facility] Adult PT Treatment/Exercise - 10/01/20 0001      Exercises   Exercises Elbow;Wrist    Other Exercises  UBE Level 4 (:20 Push/:20 Pull/:20 Rest) 5 minutes      Elbow Exercises   Elbow Flexion Limitations 3 sets of 10 slow eccentrics hammer curl 4#    Elbow Extension Limitations Triceps extensions 3 sets of 10 4# slow eccentrics    Other elbow  exercises Triceps stretch 3X 30 seconds      Wrist Exercises   Wrist Flexion Strengthening;Left;10 reps;Seated;Bar weights/barbell;Other (comment)    Bar Weights/Barbell (Wrist Flexion) 4 lbs    Wrist Flexion Limitations slow eccentrics (R side helps up, slow eccentrics down) 2 sets    Wrist Extension Strengthening;Left;10 reps;Seated;Bar weights/barbell;Limitations    Bar Weights/Barbell (Wrist Extension) 4 lbs    Wrist Extension Limitations Drop eccentrics with R side helping up (slow eccentric down) 3 sets    Bar Weights/Barbell (Radial Deviation) 4 lbs    Other wrist exercises Wrist flexors and extensors stretch 4X 20 seconds                  PT Education - 10/01/20 1421    Education Details Reviewed HEP with special emphasis on wrist flexors stretching and eccentric wrist extensors strength.    Person(s) Educated Patient    Methods Explanation;Demonstration;Verbal cues    Comprehension Verbalized understanding;Returned demonstration;Need further  instruction;Verbal cues required            PT Short Term Goals - 10/01/20 1422      PT SHORT TERM GOAL #1   Title Patient will demonstrate independent use of home exercise program to maintain progress from in clinic treatments.    Time 3    Period Weeks    Status On-going    Target Date 10/08/20      PT SHORT TERM GOAL #2   Title n/a    Status On-going             PT Long Term Goals - 10/01/20 1423      PT LONG TERM GOAL #1   Title Patient will demonstrate/report pain at worst less than or equal to 2/10 to facilitate minimal limitation in daily activity secondary to pain symptoms.    Time 10    Period Weeks    Status On-going      PT LONG TERM GOAL #2   Title Patient will demonstrate independent use of home exercise program to facilitate ability to maintain/progress functional gains from skilled physical therapy services.    Time 10    Period Weeks    Status On-going      PT LONG TERM GOAL #3   Title Pt. will demonstrate FOTO outcome > or = 73% to indicated reduced disability due to condition    Time 10    Period Weeks    Status On-going      PT LONG TERM GOAL #4   Title Pt. will demonstrate Lt wrist/elbow AROM WFL s symptoms for usual mobility of daily activity.    Time 10    Period Weeks    Status On-going      PT LONG TERM GOAL #5   Title Pt. will demonstrate Lt UE MMT 5/5 throughout to facilitate usual daily lifting, carrying, push/pull at PLOF.    Time 10    Period Weeks    Status On-going                 Plan - 10/01/20 1424    Clinical Impression Statement Roberta reports compliance with his HEP.  He has minimal pain other than an occasional jab that occurs at random (only 1-2X/week).  Continue emphasis on eccentric wrist extensors strengthening.    Examination-Activity Limitations Sleep;Carry;Lift;Reach Overhead    Examination-Participation Restrictions Community Activity;Yard Work;Other   hobbies, car work   Multimedia programmer Stable/Uncomplicated    Rehab Potential Good  PT Frequency --   1-2x/week   PT Duration Other (comment)   10wk   PT Treatment/Interventions ADLs/Self Care Home Management;Cryotherapy;Electrical Stimulation;Moist Heat;Iontophoresis 4mg /ml Dexamethasone;Therapeutic exercise;Therapeutic activities;Functional mobility training;DME Instruction;Ultrasound;Neuromuscular re-education;Passive range of motion;Joint Manipulations;Dry needling;Vasopneumatic Device;Taping;Manual techniques    PT Next Visit Plan Review new HEP, progress to light endurance based strengthening/loading.  Possible dry needling for extensor group at some point    PT Home Exercise Plan [id number]    Consulted and Agree with Plan of Care Patient           Patient will benefit from skilled therapeutic intervention in order to improve the following deficits and impairments:  Decreased endurance,Pain,Increased edema,Decreased activity tolerance,Decreased strength,Increased fascial restricitons,Impaired UE functional use,Decreased mobility,Decreased range of motion,Impaired perceived functional ability,Impaired flexibility  Visit Diagnosis: Pain in left elbow  Muscle weakness (generalized)  Stiffness of left elbow, not elsewhere classified  Localized edema     Problem List Patient Active Problem List   Diagnosis Date Noted  . Migraine 04/18/2016  . Family history of malignant neoplasm of gastrointestinal tract 09/25/2013  . Colon cancer ([place]) 01/21/2013  . Mass of colon 01/09/2013  . Hx of migraine headaches 01/09/2013  . Restless leg syndrome 01/09/2013  . History of nephrolithiasis 01/09/2013    Farley Ly PT, MPT 10/01/2020, 2:27 PM  [facility] Physical Therapy [address] [place], [place], [address] Phone: [phone]   Fax:  [phone]  Name: JONTAE ADEBAYO MRN: [id number] Date of Birth: 1966-06-03

## 2020-10-01 NOTE — Progress Notes (Signed)
Office Visit Note   Patient: Jeremiah Sanford           Date of Birth: [birth year]           MRN: [id number] Visit Date: 10/01/2020              Requested by: Gaynelle Arabian, MD [address] [place],  [place] [address] PCP: Gaynelle Arabian, MD   Assessment & Plan: Visit Diagnoses:  1. Lateral epicondylitis, left elbow     Plan: Continue physical therapy for the elbow.  Continue the Relafen which we will send back in for him.  He will follow-up with [place] as needed or if his condition fails to improve.  Questions were encouraged and answered at length.  Went over lifting techniques with him.    Follow-Up Instructions: Return if symptoms worsen or fail to improve.   Orders:  No orders of the defined types were placed in this encounter.  Meds ordered this encounter  Medications  . nabumetone (RELAFEN) 500 MG tablet    Sig: Take 1 tablet (500 mg total) by mouth 2 (two) times daily as needed.    Dispense:  60 tablet    Refill:  0      Procedures: No procedures performed   Clinical Data: No additional findings.   Subjective: Chief Complaint  Patient presents with  . Left Elbow - Routine Post Op    HPI [name] returns today for follow-up of his left elbow epicondylar release.  He has been going to physical therapy feels this is helping he is slowly improving he is unsure if the therapy or the Relafen is helping.  However he feels he is 70% improved.  Unfortunately he has a mass in his kidney has to undergo surgery for this states he will be coming off the Relafen for this. Review of Systems See HPI with otherwise negative  Objective: Vital Signs: There were no vitals taken for this visit.  Physical Exam Constitutional:      Appearance: He is not ill-appearing or diaphoretic.  Neurological:     Mental Status: He is alert and oriented to person, place, and time.  Psychiatric:        Mood and Affect: Mood normal.     Ortho Exam Surgical incision  left elbow is well healed.  He has slight tenderness over the left lateral epicondyle region.  Provocative maneuvers cause some discomfort left elbow lateral epicondyle region.  Good range of motion of the elbow otherwise without pain.    PMFS History: Patient Active Problem List   Diagnosis Date Noted  . Migraine 04/18/2016  . Family history of malignant neoplasm of gastrointestinal tract 09/25/2013  . Colon cancer ([place]) 01/21/2013  . Mass of colon 01/09/2013  . Hx of migraine headaches 01/09/2013  . Restless leg syndrome 01/09/2013  . History of nephrolithiasis 01/09/2013   Past Medical History:  Diagnosis Date  . Anemia   . Cancer ([place])   . Colon cancer ([place])   . Headache   . History of nephrolithiasis 01/09/2013  . Hx of migraine headaches 01/09/2013  . Mass of colon 01/09/2013  . Restless leg syndrome 01/09/2013   On Medication for this    Family History  Problem Relation Age of Onset  . Migraines Mother   . Healthy Father   . Heart Problems Maternal Grandmother   . Lung cancer Maternal Grandfather   . Cancer Paternal Grandmother        "eye  cancer" dx in her 58s  . Heart Problems Paternal Grandfather   . Colon cancer Other        maternal grandmother's brother  . Colon cancer Other        maternal grandfather's sister    Past Surgical History:  Procedure Laterality Date  . LAPAROSCOPIC PARTIAL COLECTOMY N/A 01/02/2013   Procedure: LAPAROSCOPIC PROXIMAL COLECTOMY;  Surgeon: Merrie Roof, MD;  Location: [place];  Service: General;  Laterality: N/A;  . PARTIAL COLECTOMY Right 01/02/2013   Procedure: PARTIAL COLECTOMY;  Surgeon: Merrie Roof, MD;  Location: [place];  Service: General;  Laterality: Right;   Social History   Occupational History  . 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 Smoking status: Never Smoker  . Smokeless tobacco: Never Used  Vaping Use  . Vaping Use: Never used  Substance and Sexual Activity  .  Alcohol use: Yes    Comment: Occasionally  . Drug use: No  . Sexual activity: Yes

## 2020-10-06 ENCOUNTER — Encounter: Payer: Self-pay | Admitting: Rehabilitative and Restorative Service Providers"

## 2020-10-06 ENCOUNTER — Ambulatory Visit: Payer: BC Managed Care – PPO | Admitting: Rehabilitative and Restorative Service Providers"

## 2020-10-06 ENCOUNTER — Other Ambulatory Visit: Payer: Self-pay

## 2020-10-06 DIAGNOSIS — R600 Localized edema: Secondary | ICD-10-CM | POA: Diagnosis not present

## 2020-10-06 DIAGNOSIS — M25522 Pain in left elbow: Secondary | ICD-10-CM | POA: Diagnosis not present

## 2020-10-06 DIAGNOSIS — M6281 Muscle weakness (generalized): Secondary | ICD-10-CM

## 2020-10-06 DIAGNOSIS — M25622 Stiffness of left elbow, not elsewhere classified: Secondary | ICD-10-CM

## 2020-10-06 NOTE — Therapy (Addendum)
[facility] Physical Therapy [address] [place], [place], [address] Phone: [phone]   Fax:  [phone]  Physical Therapy Treatment/Discharge Summary  Patient Details  Name: Jeremiah Sanford MRN: [id number] Date of Birth: 1965-09-23 Referring Provider (PT): Erskine Emery PA-C   Encounter Date: 10/06/2020   PT End of Session - 10/06/20 1530     Visit Number 4    Number of Visits 12    Date for PT Re-Evaluation 11/26/20    PT Start Time 0236    PT Stop Time 7078    PT Time Calculation (min) 38 min    Activity Tolerance Patient tolerated treatment well;No increased pain    Behavior During Therapy [facility] for tasks assessed/performed             Past Medical History:  Diagnosis Date   Anemia    Cancer ([place])    Colon cancer ([place])    Headache    History of nephrolithiasis 01/09/2013   Hx of migraine headaches 01/09/2013   Mass of colon 01/09/2013   Restless leg syndrome 01/09/2013   On Medication for this    Past Surgical History:  Procedure Laterality Date   LAPAROSCOPIC PARTIAL COLECTOMY N/A 01/02/2013   Procedure: LAPAROSCOPIC PROXIMAL COLECTOMY;  Surgeon: Merrie Roof, MD;  Location: [place];  Service: General;  Laterality: N/A;   PARTIAL COLECTOMY Right 01/02/2013   Procedure: PARTIAL COLECTOMY;  Surgeon: Merrie Roof, MD;  Location: [place];  Service: General;  Laterality: Right;    There were no vitals filed for this visit.   Subjective Assessment - 10/06/20 1525     Subjective I did alot of yard work this weekend and was fine. Not having any pain. I only have 2 visits left.    Currently in Pain? No/denies    Pain Score 0-No pain    Multiple Pain Sites No                               [facility] Adult PT Treatment/Exercise - 10/06/20 0001       Elbow Exercises   Other elbow exercises 4 lb hammer curl L x 20, 4 lb tricep ext seated x 20, 4 lb elbow supination/pronation x 20; supine elbow flex 6 lbs x 20; prone scap  retract and tricep contraction with shoulder ext x 20; prone scap retract with tricep contraction with horiz abdct x 20; prone shoulder press iso with L hand under head x 10 with 2-3 sec hold; 4 lb seated ball squeeze isometric (ball 2 lbs) 5x30 sec each with pt reports of no pain with iso holds    Other elbow exercises seated triceps stretch 2x30 sec      Wrist Exercises   Other wrist exercises 4 lb wrist extension to neutral x 20, 4 lb wrist flexion to neutral x 20, 4 lb radial/ulnar deviation x 20    Other wrist exercises seated wrist flexion/extension stretch 2x30 sec each                    PT Education - 10/06/20 1529     Education Details Reviewed HEP; pt advised to perform HEP tonight and ice to assist with potential muscle soreness. Pt to return tomorrow but may call and cancel if no pain. No pain reported at tx end.    Person(s) Educated Patient    Methods Explanation    Comprehension Verbalized understanding  PT Short Term Goals - 10/01/20 1422       PT SHORT TERM GOAL #1   Title Patient will demonstrate independent use of home exercise program to maintain progress from in clinic treatments.    Time 3    Period Weeks    Status On-going    Target Date 10/08/20      PT SHORT TERM GOAL #2   Title n/a    Status On-going               PT Long Term Goals - 10/06/20 1534       PT LONG TERM GOAL #1   Title Patient will demonstrate/report pain at worst less than or equal to 2/10 to facilitate minimal limitation in daily activity secondary to pain symptoms.    Status Achieved      PT LONG TERM GOAL #2   Title Patient will demonstrate independent use of home exercise program to facilitate ability to maintain/progress functional gains from skilled physical therapy services.    Status Achieved      PT LONG TERM GOAL #3   Title Pt. will demonstrate FOTO outcome > or = 73% to indicated reduced disability due to condition    Status On-going       PT LONG TERM GOAL #4   Title Pt. will demonstrate Lt wrist/elbow AROM WFL s symptoms for usual mobility of daily activity.    Status Achieved      PT LONG TERM GOAL #5   Title Pt. will demonstrate Lt UE MMT 5/5 throughout to facilitate usual daily lifting, carrying, push/pull at PLOF.    Status Achieved                   Plan - 10/06/20 1531     Clinical Impression Statement Pt reports no pain after working his L arm this weekend doing yardwork. PT/pt discussed discharge; pt states he has another surgery next week so he is planning on discharge as well. Pt without c/o pain or fatigue doing any exercises this visit. Pt to call and cancel next appt if no pain in AM. Pt reports he is able to functionally perform all activities without pain or deficit. Elbow AROM noted to be within functional limits.    PT Treatment/Interventions ADLs/Self Care Home Management;Cryotherapy;Electrical Stimulation;Moist Heat;Iontophoresis 65m/ml Dexamethasone;Therapeutic exercise;Therapeutic activities;Functional mobility training;DME Instruction;Ultrasound;Neuromuscular re-education;Passive range of motion;Joint Manipulations;Dry needling;Vasopneumatic Device;Taping;Manual techniques    PT Next Visit Plan potential discharge if no pain after today's treatment; pt is to call and let PT know. If he does come to appt, continue light endurance based strengthening/loading per prior plan for L elbow release.    Consulted and Agree with Plan of Care Patient             Patient will benefit from skilled therapeutic intervention in order to improve the following deficits and impairments:  Decreased endurance,Pain,Increased edema,Decreased activity tolerance,Decreased strength,Increased fascial restricitons,Impaired UE functional use,Decreased mobility,Decreased range of motion,Impaired perceived functional ability,Impaired flexibility  Visit Diagnosis: Pain in left elbow  Muscle weakness  (generalized)  Stiffness of left elbow, not elsewhere classified  Localized edema     Problem List Patient Active Problem List   Diagnosis Date Noted   Migraine 04/18/2016   Family history of malignant neoplasm of gastrointestinal tract 09/25/2013   Colon cancer ([place] 01/21/2013   Mass of colon 01/09/2013   Hx of migraine headaches 01/09/2013   Restless leg syndrome 01/09/2013   History of nephrolithiasis 01/09/2013  America Brown, PT, DPT 10/06/2020, 3:40 PM  [facility] Physical Therapy [address] [place], [place], [address] Phone: [phone]   Fax:  [phone]  Name: Jeremiah Sanford MRN: [id number] Date of Birth: Feb 26, 1966     PHYSICAL THERAPY DISCHARGE SUMMARY  Visits from Start of Care: 4  Current functional level related to goals / functional outcomes: See above   Remaining deficits: See above   Education / Equipment: HEP   Patient agrees to discharge. Patient goals were met. Patient is being discharged due to meeting the stated rehab goals.  Laureen Abrahams, PT, DPT 11/30/20 2:09 PM  [facility] Physical Therapy [address] [place], [place], [address] Phone: [phone]   Fax:  [phone]

## 2020-10-07 ENCOUNTER — Encounter: Payer: BC Managed Care – PPO | Admitting: Rehabilitative and Restorative Service Providers"

## 2021-01-07 ENCOUNTER — Ambulatory Visit (INDEPENDENT_AMBULATORY_CARE_PROVIDER_SITE_OTHER): Payer: BC Managed Care – PPO | Admitting: Orthopaedic Surgery

## 2021-01-07 ENCOUNTER — Encounter: Payer: Self-pay | Admitting: Orthopaedic Surgery

## 2021-01-07 VITALS — Ht 65.0 in | Wt 197.0 lb

## 2021-01-07 DIAGNOSIS — M546 Pain in thoracic spine: Secondary | ICD-10-CM

## 2021-01-07 MED ORDER — LIDOCAINE HCL 1 % IJ SOLN
1.0000 mL | INTRAMUSCULAR | Status: AC | PRN
  Administered 2021-01-07: 1 mL

## 2021-01-07 MED ORDER — METHYLPREDNISOLONE ACETATE 40 MG/ML IJ SUSP
40.0000 mg | INTRAMUSCULAR | Status: AC | PRN
  Administered 2021-01-07: 40 mg via INTRAMUSCULAR

## 2021-01-07 NOTE — Progress Notes (Signed)
Office Visit Note   Patient: Jeremiah Sanford           Date of Birth: [birth year]           MRN: [id number] Visit Date: 01/07/2021              Requested by: Gaynelle Arabian, MD [address] [place],  [place] [address] PCP: Gaynelle Arabian, MD   Assessment & Plan: Visit Diagnoses:  1. Trigger point of thoracic region     Plan: Per the patient's request I did provide 2 trigger point injections in the parascapular area of both shoulder sides near the thoracic spine area.  He tolerated these very well and felt better.  All questions and concerns were answered addressed.  Follow-up can be as needed.  Follow-Up Instructions: Return in about 4 weeks (around 02/04/2021).   Orders:  Orders Placed This Encounter  Procedures   Trigger Point Inj   No orders of the defined types were placed in this encounter.     Procedures: Trigger Point Inj  Date/Time: 01/07/2021 3:11 PM Performed by: Mcarthur Rossetti, MD Authorized by: Mcarthur Rossetti, MD   Total # of Trigger Points:  2 Location: shoulder   Medications #1:  1 mL lidocaine 1 %; 40 mg methylPREDNISolone acetate 40 MG/ML   Clinical Data: No additional findings.   Subjective: Chief Complaint  Patient presents with   Neck - Pain  Patient is well-known to me.  He is 55 year old gentleman and has had trigger point pain in his shoulder and thoracic area in the past.  About a year and a half ago he had a trigger point injection.  This is flared up again for about the last 3 weeks.  It hurts on the right and some on the left side with no known injury.  He denies any significant neck pain or numbness and tingling in his upper extremities.  He has tried to massage this area as well.  He is requesting trigger point injection today.  He has had no other acute changes in medical status.  He is not a diabetic.  He does not perform heavy manual labor.  HPI  Review of Systems There is currently listed no  headache, chest pain, shortness of breath, fever, chills, nausea, vomiting  Objective: Vital Signs: Ht 5\' 5"  (1.651 m)   Wt 197 lb (89.4 kg)   BMI 32.78 kg/m   Physical Exam He is alert and orient x3 and in no acute distress Ortho Exam Examination shows trigger point pain in the parascapular area just medial to the scapular border on both sides with the right worse than left. Specialty Comments:  No specialty comments available.  Imaging: No results found.   PMFS History: Patient Active Problem List   Diagnosis Date Noted   Migraine 04/18/2016   Family history of malignant neoplasm of gastrointestinal tract 09/25/2013   Colon cancer ([place]) 01/21/2013   Mass of colon 01/09/2013   Hx of migraine headaches 01/09/2013   Restless leg syndrome 01/09/2013   History of nephrolithiasis 01/09/2013   Past Medical History:  Diagnosis Date   Anemia    Cancer ([place])    Colon cancer ([place])    Headache    History of nephrolithiasis 01/09/2013   Hx of migraine headaches 01/09/2013   Mass of colon 01/09/2013   Restless leg syndrome 01/09/2013   On Medication for this    Family History  Problem Relation Age of Onset  Migraines Mother    Healthy Father    Heart Problems Maternal Grandmother    Lung cancer Maternal Grandfather    Cancer Paternal Grandmother        "eye cancer" dx in her 41s   Heart Problems Paternal Grandfather    Colon cancer Other        maternal grandmother's brother   Colon cancer Other        maternal grandfather's sister    Past Surgical History:  Procedure Laterality Date   LAPAROSCOPIC PARTIAL COLECTOMY N/A 01/02/2013   Procedure: LAPAROSCOPIC PROXIMAL COLECTOMY;  Surgeon: Merrie Roof, MD;  Location: [place];  Service: General;  Laterality: N/A;   PARTIAL COLECTOMY Right 01/02/2013   Procedure: PARTIAL COLECTOMY;  Surgeon: Merrie Roof, MD;  Location: [place];  Service: General;  Laterality: Right;   Social History   Occupational History   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

## 2022-04-19 IMAGING — MR MR ELBOW*L* W/O CM
4 of 5 series · 14 of 40 positions shown · non-contrast
Comparison: Radiographs from 03/04/2019

CLINICAL DATA: Chronic elbow pain

EXAM:
MRI OF THE LEFT ELBOW WITHOUT CONTRAST
TECHNIQUE: Multiplanar, multisequence MR imaging of the elbow was performed. No
intravenous contrast was administered.

[Series 3: T1 · axial · left · 3.0mm · 0.26mm/px · z∈[-60,+13]mm · 3 of 27 slices shown]
[im 4/27]
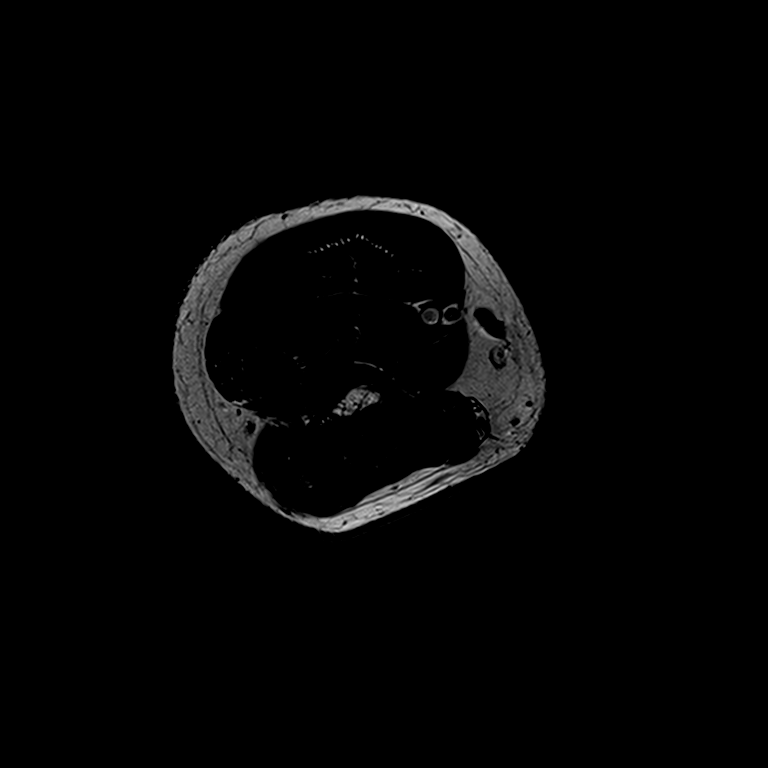
[im 15/27]
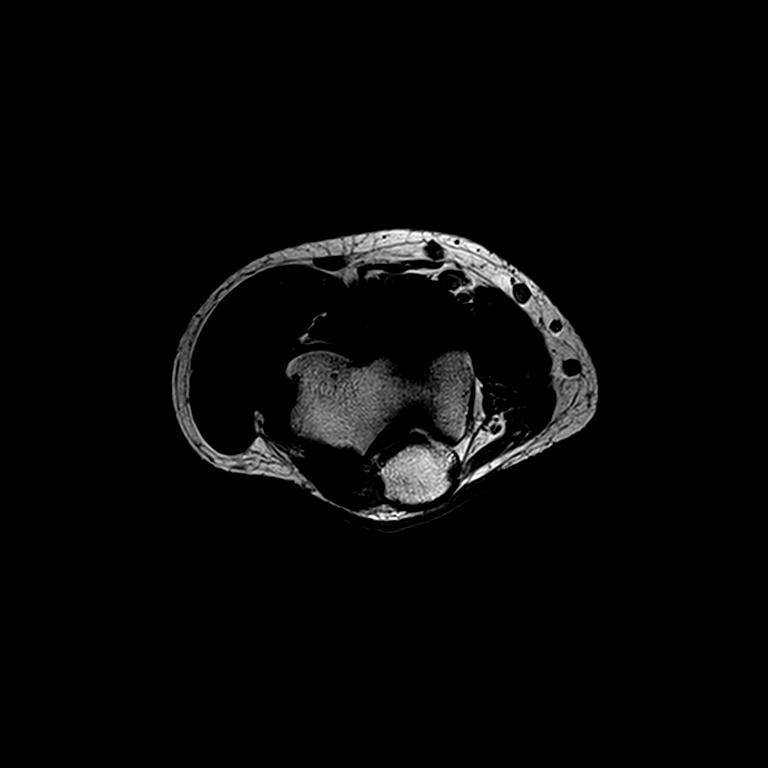
[im 23/27]
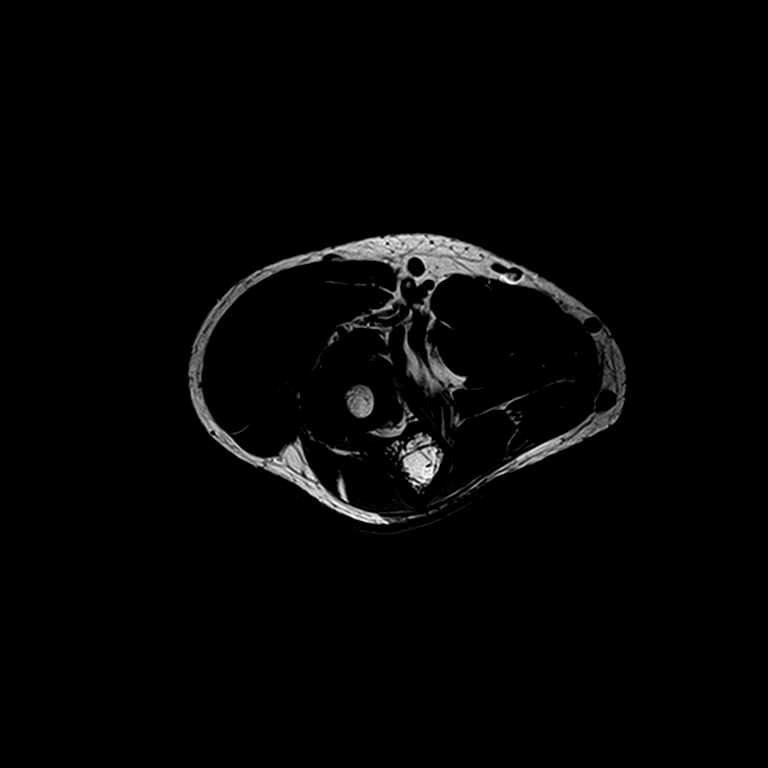

[Series 4: T2 fat-sat · coronal · left · 3.0mm · 0.22mm/px · 5 of 27 slices shown (1 of 3)]
[im 1/27]
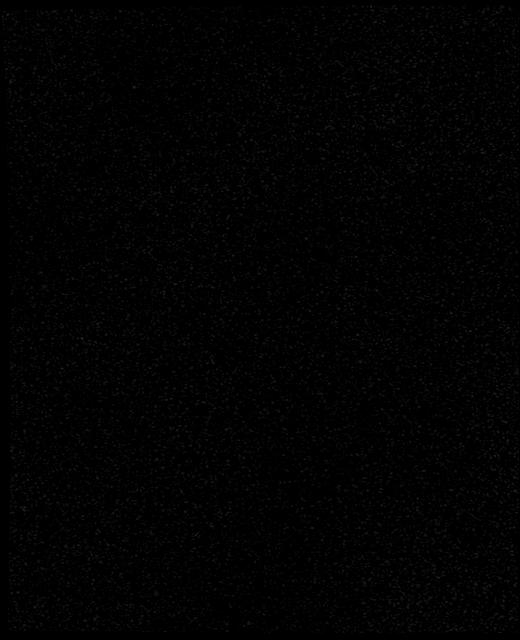
[im 4/27]
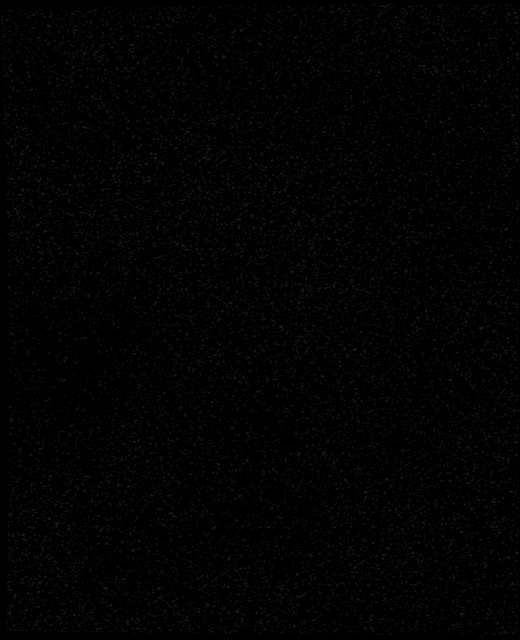
[im 8/27]
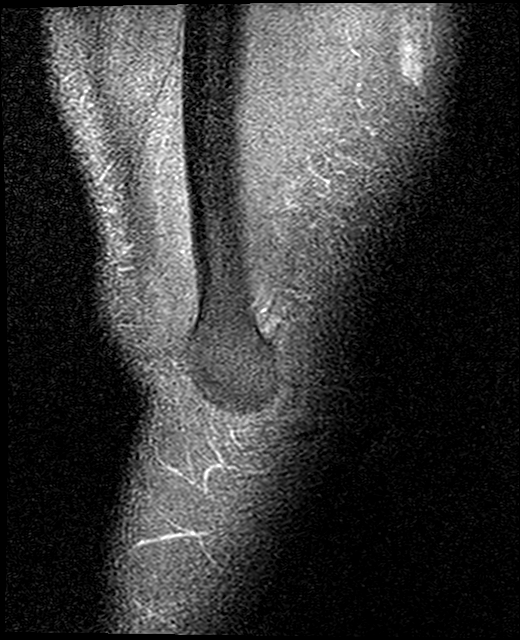
[im 15/27]
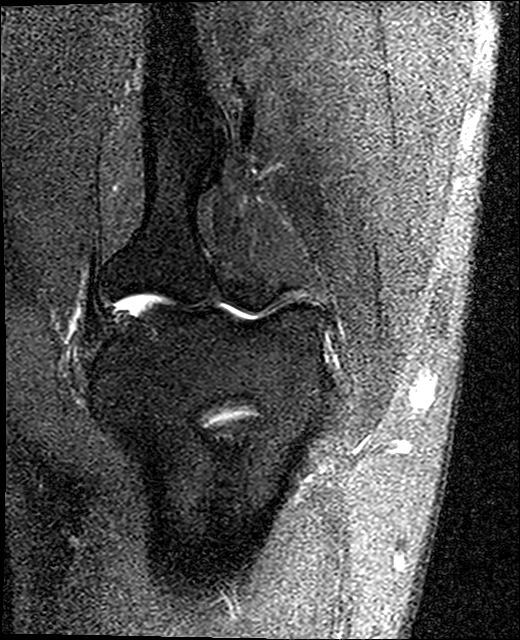
[im 23/27]
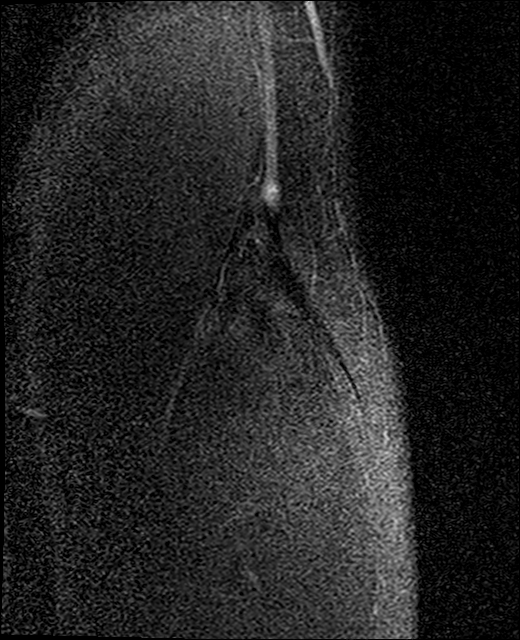

[Series 5: T2 fat-sat · axial · left · 3.0mm · 0.19mm/px · z∈[-69,+5]mm · 3 of 27 slices shown (2 of 3)]
[im 4/27]
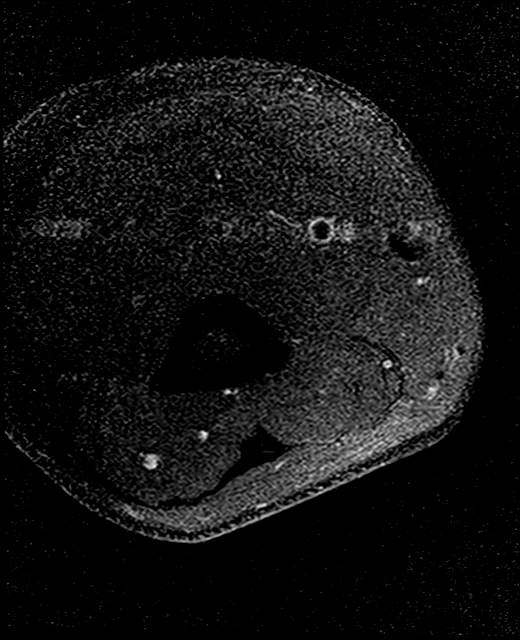
[im 15/27]
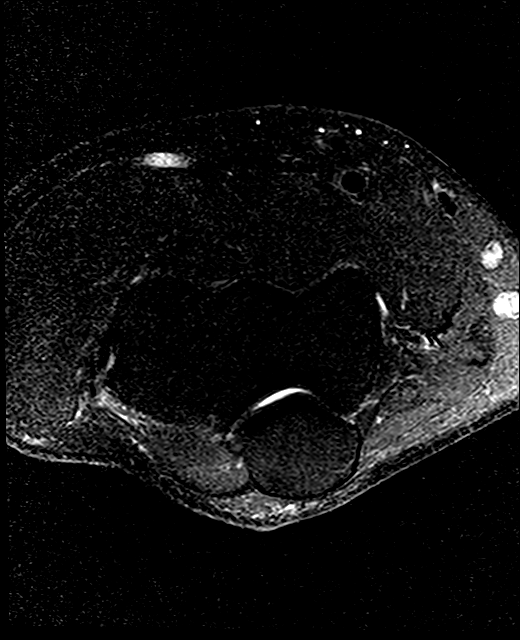
[im 23/27]
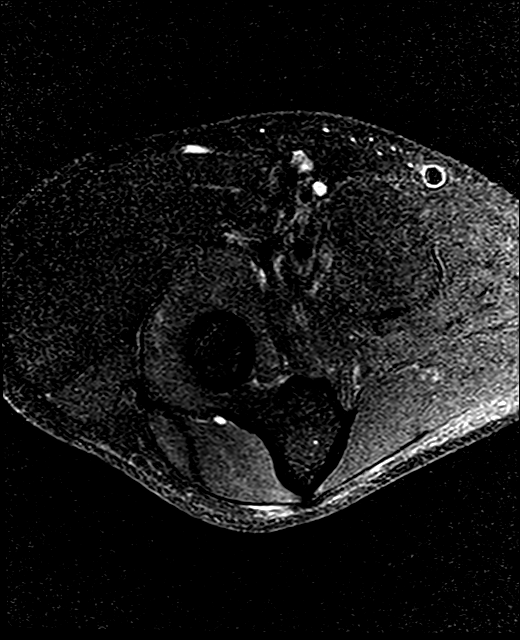

[Series 7: T2 fat-sat · sagittal · left · 3.0mm · 0.22mm/px · 3 of 27 slices shown (3 of 3)]
[im 4/27]
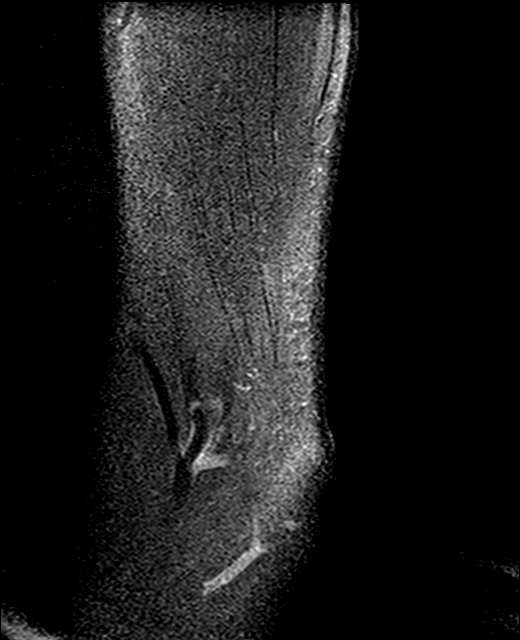
[im 15/27]
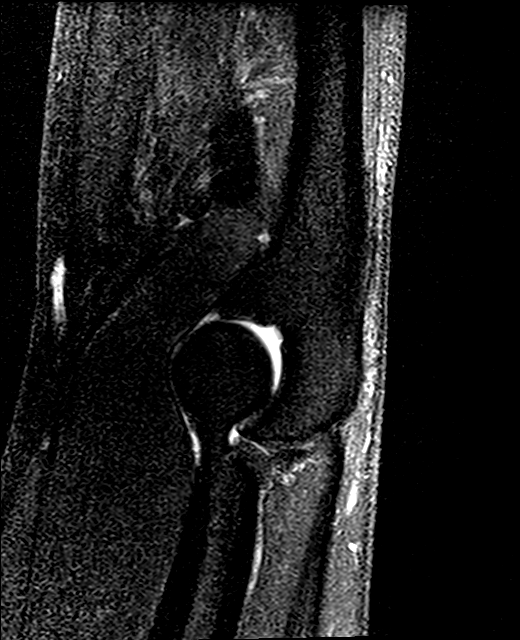
[im 23/27]
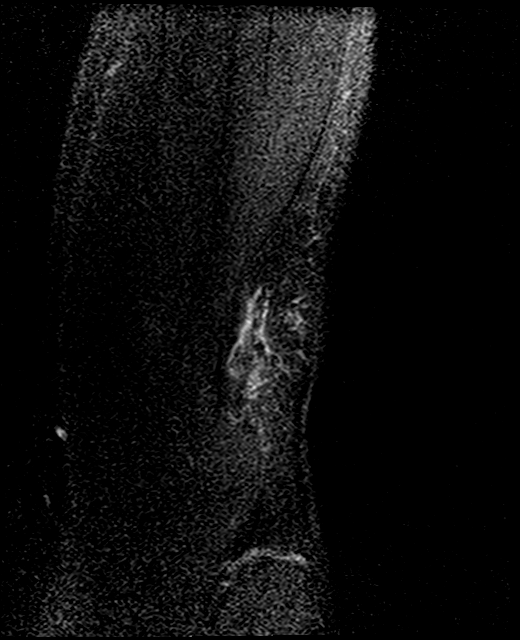

[14 of 40 positions shown; findings below may reference images not displayed]

FINDINGS: TENDONS

Common forearm flexor origin: Unremarkable

Common forearm extensor origin: Mild to moderate common extensor
tendinopathy without overt tear. Small amount of edema tracking in
the soft tissues along the posterolateral margin of the common
extensor tendon.

Biceps: Unremarkable

Triceps: Unremarkable

LIGAMENTS

Medial stabilizers: Unremarkable

Lateral stabilizers:  Unremarkable

Cartilage: No chondral defect identified.

Joint: No joint effusion or free fragment.

Cubital tunnel: Unremarkable

Bones: No significant extra-articular osseous abnormalities
identified.
IMPRESSION: 1. Mild to moderate common extensor tendinopathy/peritendinitis
without overt tear.

## 2022-05-24 ENCOUNTER — Ambulatory Visit: Payer: BC Managed Care – PPO | Admitting: Podiatry

## 2022-05-24 ENCOUNTER — Ambulatory Visit (INDEPENDENT_AMBULATORY_CARE_PROVIDER_SITE_OTHER): Payer: BC Managed Care – PPO

## 2022-05-24 DIAGNOSIS — M722 Plantar fascial fibromatosis: Secondary | ICD-10-CM | POA: Diagnosis not present

## 2022-05-24 MED ORDER — METHYLPREDNISOLONE 4 MG PO TBPK: ORAL_TABLET | ORAL | 0 refills | Status: AC

## 2022-05-24 NOTE — Progress Notes (Signed)
Subjective:   Patient ID: Jeremiah Sanford, male   DOB: 56 y.o.   MRN: [id number]   HPI Chief Complaint  Patient presents with   Plantar Fasciitis    Patient came in today for left heel and arch pain, right heel pain, rate of pain 5 out of 10, aches and shooting, TX: P.T.- dry needle, stretching, rolling,  X-Rays done today     56 year old male presents the office with above concerns.  It has been ongoing for about 7 years. He was having pain in the morning walking and at the end of the day. No injuries. No numbness or tinling. He is going to PT, no other treatment.  The right is a little worse than left.    Review of Systems  All other systems reviewed and are negative.  Past Medical History:  Diagnosis Date   Anemia    Cancer ([place])    Colon cancer ([place])    Headache    History of nephrolithiasis 01/09/2013   Hx of migraine headaches 01/09/2013   Mass of colon 01/09/2013   Restless leg syndrome 01/09/2013   On Medication for this    Past Surgical History:  Procedure Laterality Date   LAPAROSCOPIC PARTIAL COLECTOMY N/A 01/02/2013   Procedure: LAPAROSCOPIC PROXIMAL COLECTOMY;  Surgeon: Merrie Roof, MD;  Location: [facility];  Service: General;  Laterality: N/A;   PARTIAL COLECTOMY Right 01/02/2013   Procedure: PARTIAL COLECTOMY;  Surgeon: Luella Cook III, MD;  Location: [place];  Service: General;  Laterality: Right;     Current Outpatient Medications:    methylPREDNISolone (MEDROL DOSEPAK) 4 MG TBPK tablet, Take as directed, Disp: 21 tablet, Rfl: 0   Ascorbic Acid (VITAMIN C) 100 MG CHEW, 2 chewables, Disp: , Rfl:    Ascorbic Acid (VITAMIN C) 500 MG CAPS, Take 500 mg by mouth daily., Disp: , Rfl:    aspirin 81 MG tablet, Take 81 mg by mouth daily., Disp: , Rfl:    B Complex Vitamins (VITAMIN B COMPLEX) TABS, See admin instructions., Disp: , Rfl:    Cholecalciferol (VITAMIN D PO), Take 2 capsules by mouth daily., Disp: , Rfl:    Cholecalciferol (VITAMIN D) 10 MCG/ML LIQD, 2 tablets,  Disp: , Rfl:    Multiple Vitamin (MULTIVITAMIN) tablet, Take 1 tablet by mouth daily. Chewy vitamin, Disp: , Rfl:    nabumetone (RELAFEN) 500 MG tablet, Take 1 tablet (500 mg total) by mouth 2 (two) times daily as needed., Disp: 60 tablet, Rfl: 0   rizatriptan (MAXALT-MLT) 10 MG disintegrating tablet, Take 1 tablet (10 mg total) by mouth as needed. May repeat in 2 hours if needed, Disp: 15 tablet, Rfl: 11   tamsulosin (FLOMAX) 0.4 MG CAPS capsule, Take 0.4 mg by mouth daily., Disp: , Rfl:   Allergies  Allergen Reactions   Bee Venom Anaphylaxis   Fioricet [Butalbital-Apap-Caffeine] Other (See Comments)    dizziness          Objective:  Physical Exam  General: AAO x3, NAD  Dermatological: Skin is warm, dry and supple bilateral.  There are no open sores, no preulcerative lesions, no rash or signs of infection present.  Vascular: Dorsalis Pedis artery and Posterior Tibial artery pedal pulses are 2/4 bilateral with immedate capillary fill time.  There is no pain with calf compression, swelling, warmth, erythema.   Neruologic: Grossly intact via light touch bilateral.  Negative Tinel sign.  Musculoskeletal: Tenderness to palpation along the plantar medial tubercle of the calcaneus at  the insertion of plantar fascia on the left foot. Plantar fascia appears to be intact. There is no pain with lateral compression of the calcaneus or pain with vibratory sensation. There is mild discomfort along the arch of the foot.  There is no pain along the Achilles tendon.   Gait: Unassisted, Nonantalgic.       Assessment:   Left foot pain, Planter fasciitis     Plan:  -Treatment options discussed including all alternatives, risks, and complications -Etiology of symptoms were discussed -X-rays were obtained and reviewed with the patient.  3 views of the feet were obtained.  No evidence of acute fracture. -Medrol dose pack prescribed. If not improvement consider steroid injection -Plantar fascial  brace dispensed to help support and stabilize the plantar fascia.  -Stretching/icing daily -Discussed orthotics and he wants to proceed with custom. He was molded today.  No follow-ups on file.  Trula Slade DPM

## 2022-05-24 NOTE — Patient Instructions (Signed)
If was nice to meet you today. If you have any questions or any further concerns, please feel fee to give me a call. You can call our office at [phone] or please feel fee to send me a message through [facility].   Plantar Fasciitis (Heel Spur Syndrome) with Rehab The plantar fascia is a fibrous, ligament-like, soft-tissue structure that spans the bottom of the foot. Plantar fasciitis is a condition that causes pain in the foot due to inflammation of the tissue. SYMPTOMS  Pain and tenderness on the underneath side of the foot. Pain that worsens with standing or walking. CAUSES  Plantar fasciitis is caused by irritation and injury to the plantar fascia on the underneath side of the foot. Common mechanisms of injury include: Direct trauma to bottom of the foot. Damage to a small nerve that runs under the foot where the main fascia attaches to the heel bone. Stress placed on the plantar fascia due to bone spurs. RISK INCREASES WITH:  Activities that place stress on the plantar fascia (running, jumping, pivoting, or cutting). Poor strength and flexibility. Improperly fitted shoes. Tight calf muscles. Flat feet. Failure to warm-up properly before activity. Obesity. PREVENTION Warm up and stretch properly before activity. Allow for adequate recovery between workouts. Maintain physical fitness: Strength, flexibility, and endurance. Cardiovascular fitness. Maintain a health body weight. Avoid stress on the plantar fascia. Wear properly fitted shoes, including arch supports for individuals who have flat feet.  PROGNOSIS  If treated properly, then the symptoms of plantar fasciitis usually resolve without surgery. However, occasionally surgery is necessary.  RELATED COMPLICATIONS  Recurrent symptoms that may result in a chronic condition. Problems of the lower back that are caused by compensating for the injury, such as limping. Pain or weakness of the foot during push-off following  surgery. Chronic inflammation, scarring, and partial or complete fascia tear, occurring more often from repeated injections.  TREATMENT  Treatment initially involves the use of ice and medication to help reduce pain and inflammation. The use of strengthening and stretching exercises may help reduce pain with activity, especially stretches of the Achilles tendon. These exercises may be performed at home or with a therapist. Your caregiver may recommend that you use heel cups of arch supports to help reduce stress on the plantar fascia. Occasionally, corticosteroid injections are given to reduce inflammation. If symptoms persist for greater than 6 months despite non-surgical (conservative), then surgery may be recommended.   MEDICATION  If pain medication is necessary, then nonsteroidal anti-inflammatory medications, such as aspirin and ibuprofen, or other minor pain relievers, such as acetaminophen, are often recommended. Do not take pain medication within 7 days before surgery. Prescription pain relievers may be given if deemed necessary by your caregiver. Use only as directed and only as much as you need. Corticosteroid injections may be given by your caregiver. These injections should be reserved for the most serious cases, because they may only be given a certain number of times.  HEAT AND COLD Cold treatment (icing) relieves pain and reduces inflammation. Cold treatment should be applied for 10 to 15 minutes every 2 to 3 hours for inflammation and pain and immediately after any activity that aggravates your symptoms. Use ice packs or massage the area with a piece of ice (ice massage). Heat treatment may be used prior to performing the stretching and strengthening activities prescribed by your caregiver, physical therapist, or athletic trainer. Use a heat pack or soak the injury in warm water.  SEEK IMMEDIATE MEDICAL CARE IF:  Treatment seems to offer no benefit, or the condition worsens. Any  medications produce adverse side effects.  EXERCISES- RANGE OF MOTION (ROM) AND STRETCHING EXERCISES - Plantar Fasciitis (Heel Spur Syndrome) These exercises may help you when beginning to rehabilitate your injury. Your symptoms may resolve with or without further involvement from your physician, physical therapist or athletic trainer. While completing these exercises, remember:  Restoring tissue flexibility helps normal motion to return to the joints. This allows healthier, less painful movement and activity. An effective stretch should be held for at least 30 seconds. A stretch should never be painful. You should only feel a gentle lengthening or release in the stretched tissue.  RANGE OF MOTION - Toe Extension, Flexion Sit with your right / left leg crossed over your opposite knee. Grasp your toes and gently pull them back toward the top of your foot. You should feel a stretch on the bottom of your toes and/or foot. Hold this stretch for 10 seconds. Now, gently pull your toes toward the bottom of your foot. You should feel a stretch on the top of your toes and or foot. Hold this stretch for 10 seconds. Repeat  times. Complete this stretch 3 times per day.   RANGE OF MOTION - Ankle Dorsiflexion, Active Assisted Remove shoes and sit on a chair that is preferably not on a carpeted surface. Place right / left foot under knee. Extend your opposite leg for support. Keeping your heel down, slide your right / left foot back toward the chair until you feel a stretch at your ankle or calf. If you do not feel a stretch, slide your bottom forward to the edge of the chair, while still keeping your heel down. Hold this stretch for 10 seconds. Repeat 3 times. Complete this stretch 2 times per day.   STRETCH  Gastroc, Standing Place hands on wall. Extend right / left leg, keeping the front knee somewhat bent. Slightly point your toes inward on your back foot. Keeping your right / left heel on the  floor and your knee straight, shift your weight toward the wall, not allowing your back to arch. You should feel a gentle stretch in the right / left calf. Hold this position for 10 seconds. Repeat 3 times. Complete this stretch 2 times per day.  STRETCH  Soleus, Standing Place hands on wall. Extend right / left leg, keeping the other knee somewhat bent. Slightly point your toes inward on your back foot. Keep your right / left heel on the floor, bend your back knee, and slightly shift your weight over the back leg so that you feel a gentle stretch deep in your back calf. Hold this position for 10 seconds. Repeat 3 times. Complete this stretch 2 times per day.  STRETCH  Gastrocsoleus, Standing  Note: This exercise can place a lot of stress on your foot and ankle. Please complete this exercise only if specifically instructed by your caregiver.  Place the ball of your right / left foot on a step, keeping your other foot firmly on the same step. Hold on to the wall or a rail for balance. Slowly lift your other foot, allowing your body weight to press your heel down over the edge of the step. You should feel a stretch in your right / left calf. Hold this position for 10 seconds. Repeat this exercise with a slight bend in your right / left knee. Repeat 3 times. Complete this stretch 2 times per day.   STRENGTHENING EXERCISES -  Plantar Fasciitis (Heel Spur Syndrome)  These exercises may help you when beginning to rehabilitate your injury. They may resolve your symptoms with or without further involvement from your physician, physical therapist or athletic trainer. While completing these exercises, remember:  Muscles can gain both the endurance and the strength needed for everyday activities through controlled exercises. Complete these exercises as instructed by your physician, physical therapist or athletic trainer. Progress the resistance and repetitions only as guided.  STRENGTH - Towel  Curls Sit in a chair positioned on a non-carpeted surface. Place your foot on a towel, keeping your heel on the floor. Pull the towel toward your heel by only curling your toes. Keep your heel on the floor. Repeat 3 times. Complete this exercise 2 times per day.  STRENGTH - Ankle Inversion Secure one end of a rubber exercise band/tubing to a fixed object (table, pole). Loop the other end around your foot just before your toes. Place your fists between your knees. This will focus your strengthening at your ankle. Slowly, pull your big toe up and in, making sure the band/tubing is positioned to resist the entire motion. Hold this position for 10 seconds. Have your muscles resist the band/tubing as it slowly pulls your foot back to the starting position. Repeat 3 times. Complete this exercises 2 times per day.  Document Released: 06/06/2005 Document Revised: 08/29/2011 Document Reviewed: 09/18/2008 [facility] Patient Information 2014 [place], [place].

## 2022-06-15 ENCOUNTER — Other Ambulatory Visit: Payer: Self-pay | Admitting: Podiatry

## 2022-06-15 DIAGNOSIS — M722 Plantar fascial fibromatosis: Secondary | ICD-10-CM

## 2022-07-07 NOTE — Progress Notes (Signed)
Patient presents today to pick up custom molded foot orthotics recommended by Dr. Jacqualyn Posey.   Orthotics were dispensed and fit was satisfactory. Reviewed instructions for break-in and wear. Written instructions given to patient.  Patient will follow up as needed.   [facility] - order # [id number]

## 2022-07-08 ENCOUNTER — Ambulatory Visit: Payer: BC Managed Care – PPO | Admitting: *Deleted

## 2022-07-08 DIAGNOSIS — M722 Plantar fascial fibromatosis: Secondary | ICD-10-CM

## 2022-07-14 ENCOUNTER — Telehealth: Payer: Self-pay | Admitting: Orthopaedic Surgery

## 2022-07-14 NOTE — Telephone Encounter (Signed)
Pt called stating he think he might have torn something in his left knee and barely can walk. Pt is asking if he can see another provider here today or if we can open a slot for him. Please call pt about this matter at [phone].

## 2022-07-15 ENCOUNTER — Telehealth: Payer: Self-pay | Admitting: Orthopaedic Surgery

## 2022-07-15 NOTE — Telephone Encounter (Signed)
Pt called asking to be seen right away for a severe knee pain. Called pt and left vm that Autumn H states pt can see PA Audrea Muscat or Gordy Levan for knee pt.

## 2023-10-10 ENCOUNTER — Inpatient Hospital Stay: Attending: Genetic Counselor | Admitting: Genetic Counselor

## 2023-10-10 ENCOUNTER — Inpatient Hospital Stay

## 2023-10-10 ENCOUNTER — Other Ambulatory Visit: Payer: Self-pay | Admitting: Genetic Counselor

## 2023-10-10 ENCOUNTER — Encounter: Payer: Self-pay | Admitting: Genetic Counselor

## 2023-10-10 DIAGNOSIS — Z800 Family history of malignant neoplasm of digestive organs: Secondary | ICD-10-CM | POA: Insufficient documentation

## 2023-10-10 DIAGNOSIS — C189 Malignant neoplasm of colon, unspecified: Secondary | ICD-10-CM | POA: Diagnosis not present

## 2023-10-10 LAB — GENETIC SCREENING ORDER

## 2023-10-10 NOTE — Progress Notes (Signed)
 REFERRING PROVIDER: No referring provider defined for this encounter.  PRIMARY PROVIDER:  Jannelle Memory, MD (Inactive)  PRIMARY REASON FOR VISIT:  1. Family history of pancreatic cancer   2. Family history of colon cancer   3. Malignant neoplasm of colon, unspecified part of colon (HCC)      HISTORY OF PRESENT ILLNESS:   Mr. Oehlert, a 58 y.o. male, was seen for a [facility] genetics consultation due to a personal and family history of colon cancer and other cancers in the family.  Mr. Lincoln presents to clinic today to discuss the possibility of a hereditary predisposition to cancer, genetic testing, and to further clarify his future cancer risks, as well as potential cancer risks for family members.   In 2015, at the age of 63, Mr. Wuertz was diagnosed with colon cancer.  He had loss of MLH1 and PMS2 on his tumor testing which prompted genetic testing for Lynch syndrome.  He had the Lynch/Colorectal High Risk panel performed through [facility] which was negative.  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      CANCER HISTORY:  Oncology History   No history exists.    Past Medical History:  Diagnosis Date   Anemia    Cancer (HCC)    Colon cancer (HCC)    Family history of colon cancer    Family history of pancreatic cancer    Headache    History of nephrolithiasis 01/09/2013   Hx of migraine headaches 01/09/2013   Mass of colon 01/09/2013   Restless leg syndrome 01/09/2013   On Medication for this    Past Surgical History:  Procedure Laterality Date   LAPAROSCOPIC PARTIAL COLECTOMY N/A 01/02/2013   Procedure: LAPAROSCOPIC PROXIMAL COLECTOMY;  Surgeon: Mayme Spearman, MD;  Location: [facility] OR;  Service: General;  Laterality: N/A;   PARTIAL COLECTOMY Right 01/02/2013   Procedure: PARTIAL COLECTOMY;  Surgeon: Mayme Spearman, MD;  Location: [facility] OR;  Service: General;  Laterality: Right;    Social History    Socioeconomic History   Marital status: Married    Spouse name: Not on file   Number of children: 2   Years of education: HS   Highest education level: Not on file  Occupational History   Occupation: Physiological scientist: [employer]  Tobacco Use   Smoking status: Never   Smokeless tobacco: Never  Vaping Use   Vaping status: Never Used  Substance and Sexual Activity   Alcohol use: Yes    Comment: Occasionally   Drug use: No   Sexual activity: Yes  Other Topics Concern   Not on file  Social History Narrative   [name]   No caffeine use.   Right-handed.   Social Drivers of Corporate investment banker Strain: Not on file  Food Insecurity: Not on file  Transportation Needs: No Transportation Needs (10/14/2020)   [facility], [facility]   PRAPARE - Transportation    In the past 12 months, has lack of transportation kept you from medical appointments or from getting medications?: No    Lack of Transportation (Non-Medical): No  Physical Activity: Not on file  Stress: Not on file  Social Connections: Not on file     FAMILY HISTORY:  We obtained a detailed, 4-generation family history.  Significant diagnoses are listed below:  Family History  Problem Relation Age of Onset   Migraines Mother    Healthy Father    Heart Problems Maternal Grandmother    Lung cancer Maternal Grandfather    Cancer Paternal Grandmother        "eye cancer" dx in her 74s   Heart Problems Paternal Grandfather    Colon cancer Other        maternal grandmother's brother   Colon cancer Other        maternal grandfather's sister     The patient has two children who are cancer free.  He has a sister who is cancer free.  Both parents are living.  The patient's mother is cancer free. She had four sisters. One sister has a daughter who died of pancreatic cancer in her 48's.  Another sister had lung  cancer and was a smoker.  Both grandparents are deceased.  The grandfather had lung cancer.  His sister had colon cancer, and the grandmother's brother also had colon cancer.  The patient's father has a history of colon polyps.  He had a brother and sister who were cancer free. His grandmother had cancer in her eye.  Mr. Piper is unaware of previous family history of genetic testing for hereditary cancer risks. There is no reported Ashkenazi Jewish ancestry. There is no known consanguinity.  GENETIC COUNSELING ASSESSMENT: Mr. Erhart is a 58 y.o. male with a personal and family history of cancer which is somewhat suggestive of a hereditary cancer syndrome and predisposition to cancer given his young age of onset of cancer, and the family history of cancer. We, therefore, discussed and recommended the following at today's visit.   DISCUSSION: We discussed that, in general, most cancer is not inherited in families, but instead is sporadic or familial. Sporadic cancers occur by chance and typically happen at older ages (>50 years) as this type of cancer is caused by genetic changes acquired during an individual's lifetime. Some families have more cancers than would be expected by chance; however, the ages or types of cancer are not consistent with a known genetic mutation or known genetic mutations have been ruled out. This type of familial cancer is thought to be due to a combination of multiple genetic, environmental, hormonal, and lifestyle factors. While this combination of factors likely increases the risk of cancer, the exact source of this risk is not currently identifiable or testable.  We discussed that 5 - 10% of colon cancer is hereditary, with most cases associated with Lynch syndrome.  There are other genes that can be associated with hereditary colon cancer syndromes, although many have multiple polyps.  These include APC, MUTYH, BMPR1A and a few others.  We discussed that testing is beneficial  for several reasons including knowing how to follow individuals after completing their treatment, identifying whether potential treatment options such as PARP inhibitors would be beneficial, and understand if other family members could be at risk for cancer and allow them to undergo genetic testing.   We reviewed the characteristics, features and inheritance patterns of hereditary cancer syndromes. We also discussed genetic testing, including the appropriate family members to test, the process of testing, insurance coverage and turn-around-time for results. We discussed the implications of a negative, positive, carrier and/or variant of uncertain significant result. Mr. Himebaugh  was offered a common hereditary cancer panel (36+ genes) and an expanded pan-cancer panel (70+ genes). Mr. Bedwell was informed of the benefits and limitations of each panel, including that expanded pan-cancer panels  contain genes that do not have clear management guidelines at this point in time.  We also discussed that as the number of genes included on a panel increases, the chances of variants of uncertain significance increases. Mr. Sargent decided to pursue genetic testing for the CancerNext-Expanded+RNAinsight gene panel.   The CancerNext-Expanded gene panel offered by [facility] and includes sequencing, rearrangement, and RNA analysis for the following 76 genes: AIP, ALK, APC, ATM, AXIN2, BAP1, BARD1, BMPR1A, BRCA1, BRCA2, BRIP1, CDC73, CDH1, CDK4, CDKN1B, CDKN2A, CEBPA, CHEK2, CTNNA1, DDX41, DICER1, ETV6, FH, FLCN, GATA2, LZTR1, MAX, MBD4, MEN1, MET, MLH1, MSH2, MSH3, MSH6, MUTYH, NF1, NF2, NTHL1, PALB2, PHOX2B, PMS2, POT1, PRKAR1A, PTCH1, PTEN, RAD51C, RAD51D, RB1, RET, RUNX1, SDHA, SDHAF2, SDHB, SDHC, SDHD, SMAD4, SMARCA4, SMARCB1, SMARCE1, STK11, SUFU, TMEM127, TP53, TSC1, TSC2, VHL, and WT1 (sequencing and deletion/duplication); EGFR, HOXB13, KIT, MITF, PDGFRA, POLD1, and POLE (sequencing only); EPCAM and GREM1  (deletion/duplication only).    Based on Mr. Chaves personal and family history of cancer, he meets medical criteria for genetic testing.  Despite that he meets criteria, he may still have an out of pocket cost. We discussed that if his out of pocket cost for testing is over $100, the laboratory will call and confirm whether he wants to proceed with testing.  If the out of pocket cost of testing is less than $100 he will be billed by the genetic testing laboratory.   We discussed that some people do not want to undergo genetic testing due to fear of genetic discrimination.  The Genetic Information Nondiscrimination Act (GINA) was signed into federal law in 2008. GINA prohibits health insurers and most employers from discriminating against individuals based on genetic information (including the results of genetic tests and family history information). According to GINA, health insurance companies cannot consider genetic information to be a preexisting condition, nor can they use it to make decisions regarding coverage or rates. GINA also makes it illegal for most employers to use genetic information in making decisions about hiring, firing, promotion, or terms of employment. It is important to note that GINA does not offer protections for life insurance, disability insurance, or long-term care insurance. GINA does not apply to those in the [employer], those who work for companies with less than 15 employees, and new life insurance or long-term disability insurance policies.  Health status due to a cancer diagnosis is not protected under GINA. More information about GINA can be found by visiting [email or web address].  PLAN: After considering the risks, benefits, and limitations, Mr. Charney provided informed consent to pursue genetic testing and the blood sample was sent to [facility] for analysis of the CancerNext-Expanded+RNAinsight. Results should be available within approximately 2-3 weeks' time,  at which point they will be disclosed by telephone to Mr. Sgro, as will any additional recommendations warranted by these results. Mr. Besecker will receive a summary of his genetic counseling visit and a copy of his results once available. This information will also be available in Epic.   Lastly, we encouraged Mr. Wenner to remain in contact with cancer genetics annually so that we can continuously update the family history and inform him of any changes in cancer genetics and testing that may be of benefit for this family.   Mr. Hornbaker questions were answered to his satisfaction today. Our contact information was provided should additional questions or concerns arise. Thank you for the referral and allowing us  to share in the care of your patient.   Moishy Laday P. [place], MS,  CGC Licensed, Patent attorney [email or web address] phone: [phone]  30 minutes were spent on the date of the encounter in service to the patient including preparation, face-to-face consultation, documentation and care coordination.  The patient brought his wife. Drs. Johnna Nakai, and/or Gudena were available for questions, if needed..    _______________________________________________________________________ For Office Staff:  Number of people involved in session: 2 Was an Intern/ student involved with case: no

## 2023-10-30 ENCOUNTER — Ambulatory Visit: Payer: Self-pay | Admitting: Genetic Counselor

## 2023-10-30 ENCOUNTER — Telehealth: Payer: Self-pay | Admitting: Genetic Counselor

## 2023-10-30 ENCOUNTER — Encounter: Payer: Self-pay | Admitting: Genetic Counselor

## 2023-10-30 DIAGNOSIS — Z1379 Encounter for other screening for genetic and chromosomal anomalies: Secondary | ICD-10-CM | POA: Insufficient documentation

## 2023-10-30 NOTE — Telephone Encounter (Signed)
 Revealed negative genetic testing.  Discussed that we do not know why he has had colon cancer or why there is cancer in the family. It could be due to a different gene that we are not testing, or maybe our current technology may not be able to pick something up.  It will be important for him to keep in contact with genetics to keep up with whether additional testing may be needed.

## 2023-10-30 NOTE — Progress Notes (Signed)
 HPI:  [name] was previously seen in the [facility] due to a personal and family history of cancer and concerns regarding a hereditary predisposition to cancer. Please refer to our prior cancer genetics clinic note for more information regarding our discussion, assessment and recommendations, at the time. [name] recent genetic test results were disclosed to him, as were recommendations warranted by these results. These results and recommendations are discussed in more detail below.  CANCER HISTORY:  Oncology History  Colon cancer (HCC)  01/21/2013 Initial Diagnosis   Colon cancer (HCC)   10/30/2023 Genetic Testing   Negative genetic testing on the CancerNext-Expanded+RNAinsight panel.  The report date is Oct 30, 2023.  The CancerNext-Expanded gene panel offered by [facility] and includes sequencing, rearrangement, and RNA analysis for the following 77 genes: AIP, ALK, APC, ATM, BAP1, BARD1, BMPR1A, BRCA1, BRCA2, BRIP1, CDC73, CDH1, CDK4, CDKN1B, CDKN2A, CEBPA, CHEK2, CTNNA1, DDX41, DICER1, ETV6, FH, FLCN, GATA2, LZTR1, MAX, MBD4, MEN1, MET, MLH1, MSH2, MSH3, MSH6, MUTYH, NF1, NF2, NTHL1, PALB2, PHOX2B, PMS2, POT1, PRKAR1A, PTCH1, PTEN, RAD51C, RAD51D, RB1, RET, RPS20, RUNX1, SDHA, SDHAF2, SDHB, SDHC, SDHD, SMAD4, SMARCA4, SMARCB1, SMARCE1, STK11, SUFU, TMEM127, TP53, TSC1, TSC2, VHL, and WT1 (sequencing and deletion/duplication); AXIN2, CTNNA1, DDX41, EGFR, HOXB13, KIT, MBD4, MITF, MSH3, PDGFRA, POLD1 and POLE (sequencing only); EPCAM and GREM1 (deletion/duplication only). RNA data is routinely analyzed for use in variant interpretation for all genes.     FAMILY HISTORY:  We obtained a detailed, 4-generation family history.  Significant diagnoses are listed below: Family History  Problem Relation Age of Onset   Migraines Mother    Healthy Father    Heart Problems Maternal Grandmother    Lung cancer Maternal Grandfather    Cancer Paternal Grandmother         "eye cancer" dx in her 50s   Heart Problems Paternal Grandfather    Colon cancer Other        maternal grandmother's brother   Colon cancer Other        maternal grandfather's sister       The patient has two children who are cancer free.  He has a sister who is cancer free.  Both parents are living.   The patient's mother is cancer free. She had four sisters. One sister has a daughter who died of pancreatic cancer in her 33's.  Another sister had lung cancer and was a smoker.  Both grandparents are deceased.  The grandfather had lung cancer.  His sister had colon cancer, and the grandmother's brother also had colon cancer.   The patient's father has a history of colon polyps.  He had a brother and sister who were cancer free. His grandmother had cancer in her eye.   [name] is unaware of previous family history of genetic testing for hereditary cancer risks. There is no reported Ashkenazi Jewish ancestry. There is no known consanguinity  GENETIC TEST RESULTS: Genetic testing reported out on Oct 30, 2023 through the CancerNext-Expanded+RNAinsight cancer panel found no pathogenic mutations. The CancerNext-Expanded gene panel offered by [facility] and includes sequencing, rearrangement, and RNA analysis for the following 77 genes: AIP, ALK, APC, ATM, BAP1, BARD1, BMPR1A, BRCA1, BRCA2, BRIP1, CDC73, CDH1, CDK4, CDKN1B, CDKN2A, CEBPA, CHEK2, CTNNA1, DDX41, DICER1, ETV6, FH, FLCN, GATA2, LZTR1, MAX, MBD4, MEN1, MET, MLH1, MSH2, MSH3, MSH6, MUTYH, NF1, NF2, NTHL1, PALB2, PHOX2B, PMS2, POT1, PRKAR1A, PTCH1, PTEN, RAD51C, RAD51D, RB1, RET, RPS20, RUNX1, SDHA, SDHAF2, SDHB, SDHC, SDHD, SMAD4, SMARCA4, SMARCB1, SMARCE1,  STK11, SUFU, TMEM127, TP53, TSC1, TSC2, VHL, and WT1 (sequencing and deletion/duplication); AXIN2, CTNNA1, DDX41, EGFR, HOXB13, KIT, MBD4, MITF, MSH3, PDGFRA, POLD1 and POLE (sequencing only); EPCAM and GREM1 (deletion/duplication only). RNA data is routinely analyzed for use in  variant interpretation for all genes. The test report has been scanned into EPIC and is located under the Molecular Pathology section of the Results Review tab.  A portion of the result report is included below for reference.     We discussed with [name] that because current genetic testing is not perfect, it is possible there may be a gene mutation in one of these genes that current testing cannot detect, but that chance is small.  We also discussed, that there could be another gene that has not yet been discovered, or that we have not yet tested, that is responsible for the cancer diagnoses in the family. It is also possible there is a hereditary cause for the cancer in the family that [name] did not inherit and therefore was not identified in his testing.  Therefore, it is important to Jeremiah Sanford in touch with cancer genetics in the future so that we can continue to offer [name] the most up to date genetic testing.   ADDITIONAL GENETIC TESTING: We discussed with [name] that his genetic testing was fairly extensive.  If there are genes identified to increase cancer risk that can be analyzed in the future, we would be happy to discuss and coordinate this testing at that time.    CANCER SCREENING RECOMMENDATIONS: [name] test result is considered negative (normal).  This means that we have not identified a hereditary cause for his personal and family history of cancer at this time. Most cancers happen by chance and this negative test suggests that his personal and family history of cancer may fall into this category.    Possible reasons for [name]'s negative genetic test include:  1. There may be a gene mutation in one of these genes that current testing methods cannot detect but that chance is small.  2. There could be another gene that has not yet been discovered, or that we have not yet tested, that is responsible for the cancer diagnoses in the family.  3.  There may be no  hereditary risk for cancer in the family. The cancers in [name] and/or his family may be sporadic/familial or due to other genetic and environmental factors. 4. It is also possible there is a hereditary cause for the cancer in the family that [name] did not inherit.  Therefore, it is recommended he continue to follow the cancer management and screening guidelines provided by his oncology and primary healthcare provider. An individual's cancer risk and medical management are not determined by genetic test results alone. Overall cancer risk assessment incorporates additional factors, including personal medical history, family history, and any available genetic information that may result in a personalized plan for cancer prevention and surveillance  This negative genetic test simply tells us  that we cannot yet define why [name] has had colorectal cancer at a young age. [name]'s medical management and screening should be based on the prospect that he may be at an increased risk for a second colorectal cancer in the future and should, therefore, undergo more frequent colonoscopy screening at intervals determined by his GI providers.  We also recommended that [name] have an upper endoscopy periodically.  RECOMMENDATIONS FOR FAMILY MEMBERS:   Since he did not inherit  a identifiable mutation in a cancer predisposition gene included on this panel, his children could not have inherited a known mutation from his in one of these genes. Individuals in this family might be at some increased risk of developing cancer, over the general population risk, simply due to the family history of cancer.  We recommended women in this family have a yearly mammogram beginning at age 50, or 21 years younger than the earliest onset of cancer, an annual clinical breast exam, and perform monthly breast self-exams. Women in this family should also have a gynecological exam as recommended by their primary provider. All  family members should be referred for colonoscopy starting at age 66, or 50 years younger than the earliest onset of cancer.  FOLLOW-UP: Lastly, we discussed with [name] that cancer genetics is a rapidly advancing field and it is possible that new genetic tests will be appropriate for him and/or his family members in the future. We encouraged him to Jeremiah Sanford in contact with cancer genetics on an annual basis so we can update his personal and family histories and let him know of advances in cancer genetics that may benefit this family.   Our contact number was provided. [name] questions were answered to his satisfaction, and he knows he is welcome to call us  at anytime with additional questions or concerns.   Marijo Shove, MS, [facility] Licensed, Certified Genetic Counselor [email or web address]
